# Patient Record
Sex: Male | Born: 1960 | Race: White | Hispanic: No | Marital: Married | Smoking: Current every day smoker
Health system: Southern US, Community
[De-identification: ages and names within clinical notes are randomized; demographics above are authoritative.]

## PROBLEM LIST (undated history)

## (undated) DIAGNOSIS — E785 Hyperlipidemia, unspecified: Secondary | ICD-10-CM

## (undated) DIAGNOSIS — K219 Gastro-esophageal reflux disease without esophagitis: Secondary | ICD-10-CM

## (undated) DIAGNOSIS — E119 Type 2 diabetes mellitus without complications: Secondary | ICD-10-CM

## (undated) DIAGNOSIS — Z86010 Personal history of colonic polyps: Secondary | ICD-10-CM

## (undated) DIAGNOSIS — Z860101 Personal history of adenomatous and serrated colon polyps: Secondary | ICD-10-CM

## (undated) DIAGNOSIS — I10 Essential (primary) hypertension: Secondary | ICD-10-CM

## (undated) DIAGNOSIS — G510 Bell's palsy: Secondary | ICD-10-CM

## (undated) DIAGNOSIS — T7840XA Allergy, unspecified, initial encounter: Secondary | ICD-10-CM

## (undated) HISTORY — PX: CARDIAC CATHETERIZATION: SHX172

## (undated) HISTORY — PX: OTHER SURGICAL HISTORY: SHX169

## (undated) HISTORY — DX: Allergy, unspecified, initial encounter: T78.40XA

## (undated) HISTORY — DX: Essential (primary) hypertension: I10

## (undated) HISTORY — DX: Personal history of adenomatous and serrated colon polyps: Z86.0101

## (undated) HISTORY — DX: Type 2 diabetes mellitus without complications: E11.9

## (undated) HISTORY — DX: Personal history of colonic polyps: Z86.010

## (undated) HISTORY — DX: Gastro-esophageal reflux disease without esophagitis: K21.9

## (undated) HISTORY — PX: COLONOSCOPY: SHX174

## (undated) HISTORY — PX: LUMBAR PUNCTURE: SHX1985

## (undated) HISTORY — DX: Hyperlipidemia, unspecified: E78.5

## (undated) HISTORY — DX: Bell's palsy: G51.0

## (undated) HISTORY — PX: POLYPECTOMY: SHX149

---

## 2005-05-06 ENCOUNTER — Observation Stay (HOSPITAL_COMMUNITY): Admission: EM | Admit: 2005-05-06 | Discharge: 2005-05-06 | Payer: Self-pay | Admitting: Emergency Medicine

## 2005-05-07 ENCOUNTER — Ambulatory Visit: Payer: Self-pay

## 2005-05-14 ENCOUNTER — Ambulatory Visit: Payer: Self-pay | Admitting: Internal Medicine

## 2005-05-15 ENCOUNTER — Inpatient Hospital Stay (HOSPITAL_BASED_OUTPATIENT_CLINIC_OR_DEPARTMENT_OTHER): Admission: RE | Admit: 2005-05-15 | Discharge: 2005-05-15 | Payer: Self-pay | Admitting: Internal Medicine

## 2005-05-28 ENCOUNTER — Ambulatory Visit: Payer: Self-pay | Admitting: Internal Medicine

## 2010-08-13 ENCOUNTER — Encounter (INDEPENDENT_AMBULATORY_CARE_PROVIDER_SITE_OTHER): Payer: Self-pay | Admitting: *Deleted

## 2010-08-21 NOTE — Letter (Signed)
Summary: Pre Visit Letter Revised  [facility]  [address] [place], [place] [address]   Phone: [phone]  Fax: [phone]        08/13/2010 MRN: [id number] John Khan [address] [place], [place]  [address]             Procedure Date:  September 04, 2010   dir col-Dr Annamarie Major to the Gastroenterology Division at [facility].    You are scheduled to see a nurse for your pre-procedure visit on August 22, 2010 at 8:00am on the 3rd floor at [facility], [address]  We ask that you try to arrive at our office 15 minutes prior to your appointment time to allow for check-in.  Please take a minute to review the attached form.  If you answer "Yes" to one or more of the questions on the first page, we ask that you call the person listed at your earliest opportunity.  If you answer "No" to all of the questions, please complete the rest of the form and bring it to your appointment.    Your nurse visit will consist of discussing your medical and surgical history, your immediate family medical history, and your medications.   If you are unable to list all of your medications on the form, please bring the medication bottles to your appointment and we will list them.  We will need to be aware of both prescribed and over the counter drugs.  We will need to know exact dosage information as well.    Please be prepared to read and sign documents such as consent forms, a financial agreement, and acknowledgement forms.  If necessary, and with your consent, a friend or relative is welcome to sit-in on the nurse visit with you.  Please bring your insurance card so that we may make a copy of it.  If your insurance requires a referral to see a specialist, please bring your referral form from your primary care physician.  No co-pay is required for this nurse visit.     If you cannot keep your appointment, please call [phone] to cancel or reschedule prior to your  appointment date.  This allows [place] the opportunity to schedule an appointment for another patient in need of care.    Thank you for choosing [facility] for your medical needs.  We appreciate the opportunity to care for you.  Please visit [place] at our website  to learn more about our practice.  Sincerely, The Gastroenterology Division

## 2010-08-22 ENCOUNTER — Ambulatory Visit (AMBULATORY_SURGERY_CENTER): Payer: BC Managed Care – PPO | Admitting: *Deleted

## 2010-08-22 ENCOUNTER — Encounter: Payer: Self-pay | Admitting: Gastroenterology

## 2010-08-22 VITALS — Ht 67.0 in | Wt 214.0 lb

## 2010-08-22 DIAGNOSIS — Z1211 Encounter for screening for malignant neoplasm of colon: Secondary | ICD-10-CM

## 2010-08-22 MED ORDER — PEG-KCL-NACL-NASULF-NA ASC-C 100 G PO SOLR: 1.0000 | Freq: Once | ORAL | Status: AC

## 2010-09-03 ENCOUNTER — Encounter: Payer: Self-pay | Admitting: Gastroenterology

## 2010-09-04 ENCOUNTER — Ambulatory Visit (AMBULATORY_SURGERY_CENTER): Payer: BC Managed Care – PPO | Admitting: Gastroenterology

## 2010-09-04 ENCOUNTER — Encounter: Payer: Self-pay | Admitting: Gastroenterology

## 2010-09-04 VITALS — BP 125/96 | HR 76 | Temp 97.8°F | Resp 18 | Ht 67.0 in | Wt 215.0 lb

## 2010-09-04 DIAGNOSIS — D126 Benign neoplasm of colon, unspecified: Secondary | ICD-10-CM

## 2010-09-04 DIAGNOSIS — Z1211 Encounter for screening for malignant neoplasm of colon: Secondary | ICD-10-CM

## 2010-09-04 DIAGNOSIS — K620 Anal polyp: Secondary | ICD-10-CM

## 2010-09-04 NOTE — Progress Notes (Signed)
Pr stated did not pass gas, but feels better, pt d/c home

## 2010-09-04 NOTE — Patient Instructions (Signed)
Discharge instructions reviewed  Polyp education given  HOLD aspirin, aspirin products, and anti-inflammatories for 2 weeks  Pathology results should be received in approximately 2 weeks via mail

## 2010-09-04 NOTE — Progress Notes (Signed)
Pt escorted to bathroom to pass gas, states feels better just "tummy feels tight"

## 2010-09-05 ENCOUNTER — Telehealth: Payer: Self-pay | Admitting: *Deleted

## 2010-09-05 NOTE — Telephone Encounter (Signed)

## 2010-09-11 ENCOUNTER — Encounter: Payer: Self-pay | Admitting: Gastroenterology

## 2011-01-21 ENCOUNTER — Encounter: Payer: Self-pay | Admitting: *Deleted

## 2012-05-06 LAB — LIPID PANEL
Cholesterol: 189 mg/dL (ref 0–200)
HDL: 32 mg/dL — AB (ref 35–70)

## 2012-05-06 LAB — HM DIABETES EYE EXAM

## 2012-05-06 LAB — BASIC METABOLIC PANEL: Glucose: 118 mg/dL

## 2012-08-14 ENCOUNTER — Encounter: Payer: Self-pay | Admitting: Physician Assistant

## 2012-08-18 ENCOUNTER — Ambulatory Visit (INDEPENDENT_AMBULATORY_CARE_PROVIDER_SITE_OTHER): Payer: BC Managed Care – PPO | Admitting: Physician Assistant

## 2012-08-18 ENCOUNTER — Encounter: Payer: Self-pay | Admitting: Physician Assistant

## 2012-08-18 VITALS — BP 153/97 | HR 72 | Temp 97.8°F | Ht 67.0 in | Wt 213.0 lb

## 2012-08-18 DIAGNOSIS — E1149 Type 2 diabetes mellitus with other diabetic neurological complication: Secondary | ICD-10-CM

## 2012-08-18 DIAGNOSIS — G4700 Insomnia, unspecified: Secondary | ICD-10-CM

## 2012-08-18 DIAGNOSIS — E785 Hyperlipidemia, unspecified: Secondary | ICD-10-CM

## 2012-08-18 DIAGNOSIS — I10 Essential (primary) hypertension: Secondary | ICD-10-CM | POA: Insufficient documentation

## 2012-08-18 DIAGNOSIS — E119 Type 2 diabetes mellitus without complications: Secondary | ICD-10-CM

## 2012-08-18 LAB — CBC WITH DIFFERENTIAL/PLATELET
Basophils Absolute: 0.1 10*3/uL (ref 0.0–0.1)
Basophils Relative: 1 % (ref 0–1)
Eosinophils Relative: 5 % (ref 0–5)
HCT: 43.8 % (ref 39.0–52.0)
MCH: 31.1 pg (ref 26.0–34.0)
MCHC: 34.7 g/dL (ref 30.0–36.0)
MCV: 89.6 fL (ref 78.0–100.0)
Monocytes Absolute: 0.7 10*3/uL (ref 0.1–1.0)
RDW: 13.4 % (ref 11.5–15.5)

## 2012-08-18 LAB — COMPREHENSIVE METABOLIC PANEL
AST: 19 U/L (ref 0–37)
BUN: 11 mg/dL (ref 6–23)
Calcium: 9.3 mg/dL (ref 8.4–10.5)
Chloride: 103 mEq/L (ref 96–112)
Creat: 0.89 mg/dL (ref 0.50–1.35)

## 2012-08-18 MED ORDER — AMITRIPTYLINE HCL 10 MG PO TABS: 10.0000 mg | ORAL_TABLET | Freq: Every day | ORAL | Status: DC

## 2012-08-18 MED ORDER — METFORMIN HCL 500 MG PO TABS: 500.0000 mg | ORAL_TABLET | Freq: Every day | ORAL | Status: DC

## 2012-08-18 MED ORDER — AMLODIPINE BESYLATE 10 MG PO TABS: 10.0000 mg | ORAL_TABLET | Freq: Every day | ORAL | Status: DC

## 2012-08-18 MED ORDER — ROSUVASTATIN CALCIUM 10 MG PO TABS: 10.0000 mg | ORAL_TABLET | Freq: Every day | ORAL | Status: DC

## 2012-08-18 NOTE — Progress Notes (Signed)
  Subjective:    Patient ID: John Khan, male    DOB: [birth year], 52 y.o.   MRN: [id number]  HPI Comments: Recheck diabetes, htn, hyperlipidemia; Dec gut bug has resolved  Diabetes  Hypertension  Hyperlipidemia      Review of Systems  All other systems reviewed and are negative.       Objective:   Physical Exam  Constitutional: He is oriented to person, place, and time. He appears well-developed and well-nourished.  HENT:  Head: Normocephalic and atraumatic.  Right Ear: External ear normal.  Left Ear: External ear normal.  Nose: Nose normal.  Mouth/Throat: Oropharynx is clear and moist.  Eyes: Conjunctivae and EOM are normal. Pupils are equal, round, and reactive to light.  Neck: Normal range of motion. Neck supple. No JVD present. No tracheal deviation present. No thyromegaly present.  Cardiovascular: Normal rate, regular rhythm and intact distal pulses.   No murmur heard. Pulmonary/Chest: Effort normal and breath sounds normal.  Abdominal: Soft. Bowel sounds are normal. There is no tenderness.  Musculoskeletal: Normal range of motion. He exhibits no edema.  Neurological: He is alert and oriented to person, place, and time. He has normal reflexes.  Skin: Skin is warm and dry.  Psychiatric: He has a normal mood and affect. His behavior is normal. Judgment and thought content normal.          Assessment & Plan:

## 2012-08-19 NOTE — Progress Notes (Signed)
Quick Note:  Labs were within normal limits ______ 

## 2012-08-26 ENCOUNTER — Telehealth: Payer: Self-pay | Admitting: *Deleted

## 2012-08-26 NOTE — Telephone Encounter (Signed)
John Khan stopped by to let us know that John Khan had not been taking his cholesterol and hypertension medication approx 2 weeks to 1 month prior to his appt and labwork.  He has since restarted it and is taking is regularly now.

## 2012-08-26 NOTE — Telephone Encounter (Signed)
Thanks for the update

## 2012-09-01 ENCOUNTER — Encounter: Payer: Self-pay | Admitting: Physician Assistant

## 2012-11-18 ENCOUNTER — Encounter: Payer: Self-pay | Admitting: General Practice

## 2012-11-18 ENCOUNTER — Encounter (INDEPENDENT_AMBULATORY_CARE_PROVIDER_SITE_OTHER): Payer: BC Managed Care – PPO | Admitting: General Practice

## 2012-11-18 NOTE — Progress Notes (Signed)
Patient ID: John Khan, male   DOB: Jan 08, 1961, 52 y.o.   MRN: [id number]  Patient left exam room without being seen by a provider.

## 2012-12-09 ENCOUNTER — Encounter: Payer: Self-pay | Admitting: Family Medicine

## 2012-12-09 ENCOUNTER — Ambulatory Visit (INDEPENDENT_AMBULATORY_CARE_PROVIDER_SITE_OTHER): Payer: BC Managed Care – PPO | Admitting: Family Medicine

## 2012-12-09 VITALS — BP 142/86 | HR 85 | Temp 99.4°F | Ht 67.0 in | Wt 214.6 lb

## 2012-12-09 DIAGNOSIS — B353 Tinea pedis: Secondary | ICD-10-CM

## 2012-12-09 DIAGNOSIS — I10 Essential (primary) hypertension: Secondary | ICD-10-CM

## 2012-12-09 DIAGNOSIS — Z0000 Encounter for general adult medical examination without abnormal findings: Secondary | ICD-10-CM

## 2012-12-09 DIAGNOSIS — E119 Type 2 diabetes mellitus without complications: Secondary | ICD-10-CM

## 2012-12-09 DIAGNOSIS — E785 Hyperlipidemia, unspecified: Secondary | ICD-10-CM

## 2012-12-09 LAB — POCT CBC
Granulocyte percent: 61.3 %G (ref 37–80)
HCT, POC: 44.3 % (ref 43.5–53.7)
Hemoglobin: 15.9 g/dL (ref 14.1–18.1)
Lymph, poc: 3.6 — AB (ref 0.6–3.4)
MCH, POC: 32.8 pg — AB (ref 27–31.2)
MCHC: 35.9 g/dL — AB (ref 31.8–35.4)
MCV: 91.4 fL (ref 80–97)
MPV: 8.6 fL (ref 0–99.8)
POC Granulocyte: 6.4 (ref 2–6.9)
POC LYMPH PERCENT: 34.3 %L (ref 10–50)
Platelet Count, POC: 227 10*3/uL (ref 142–424)
RBC: 4.8 M/uL (ref 4.69–6.13)
RDW, POC: 13.4 %
WBC: 10.5 10*3/uL — AB (ref 4.6–10.2)

## 2012-12-09 LAB — LIPID PANEL
Cholesterol: 163 mg/dL (ref 0–200)
HDL: 35 mg/dL — ABNORMAL LOW (ref >39)
LDL Cholesterol: 94 mg/dL (ref 0–99)
Total CHOL/HDL Ratio: 4.7 Ratio
Triglycerides: 171 mg/dL — ABNORMAL HIGH (ref <150)
VLDL: 34 mg/dL (ref 0–40)

## 2012-12-09 LAB — POCT GLYCOSYLATED HEMOGLOBIN (HGB A1C): Hemoglobin A1C: 6.4

## 2012-12-09 MED ORDER — BUTENAFINE HCL 1 % EX CREA: 30.0000 g | TOPICAL_CREAM | Freq: Two times a day (BID) | CUTANEOUS | Status: DC

## 2012-12-09 MED ORDER — ROSUVASTATIN CALCIUM 10 MG PO TABS: 10.0000 mg | ORAL_TABLET | Freq: Every day | ORAL | Status: DC

## 2012-12-09 MED ORDER — MUPIROCIN CALCIUM 2 % EX CREA: TOPICAL_CREAM | Freq: Three times a day (TID) | CUTANEOUS | Status: DC

## 2012-12-09 MED ORDER — METFORMIN HCL 500 MG PO TABS: 500.0000 mg | ORAL_TABLET | Freq: Every day | ORAL | Status: DC

## 2012-12-09 MED ORDER — AMLODIPINE BESYLATE 10 MG PO TABS: 10.0000 mg | ORAL_TABLET | Freq: Every day | ORAL | Status: DC

## 2012-12-09 NOTE — Progress Notes (Signed)
  Subjective:    Patient ID: John Khan, male    DOB: Apr 20, 1961, 52 y.o.   MRN: [id number]  HPI This 52 y.o. male presents for evaluation of hypertension, hyperlipidemia, and diabetes.  He has some problems with tinea pedis And uses mentax cream which helps.  He has had colonoscopy 2 years ago.  He is a cigarette smoker and is planning on quitting. He has hx of DM, hyperlipidemia,hypertension, and tobacco abuse.     Review of Systems    No chest pain, SOB, HA, dizziness, vision change, N/V, diarrhea, constipation, dysuria, urinary urgency or frequency, myalgias, arthralgias or rash.  Objective:   Physical Exam Vital signs noted  Well developed well nourished male.  HEENT - Head atraumatic Normocephalic                Eyes - PERRLA, Conjuctiva - clear Sclera- Clear EOMI                Ears - EAC's Wnl TM's Wnl Gross Hearing WNL                Nose - Nares patent                 Throat - oropharanx wnl Respiratory - Lungs CTA bilateral Cardiac - RRR S1 and S2 without murmur GI - Abdomen soft Nontender and bowel sounds active x 4 Extremities - No edema. Neuro - Grossly intact.       Assessment & Plan:  Routine general medical examination at a health care facility - Plan: PSA, COMPLETE METABOLIC PANEL WITH GFR, Lipid panel, POCT glycosylated hemoglobin (Hb A1C), POCT CBC Up to date with colonoscopy, discussed getting eye exam, discussed quitting smoking.  Follow up in 3 months.  Diabetes - Plan: metFORMIN (GLUCOPHAGE) 500 MG tablet bid, check hgbaic.  Follow up in 3 months.  Tinea pedis - Plan: Butenafine HCl (MENTAX) 1 % cream, mupirocin cream (BACTROBAN) 2 % ointimen.  Other and unspecified hyperlipidemia - Plan: rosuvastatin (CRESTOR) 10 MG tablet,check FLP today.  Essential hypertension, benign - Plan: amLODipine (NORVASC) 10 MG tablet, controlled and continue current regimen.

## 2012-12-09 NOTE — Patient Instructions (Signed)

## 2012-12-10 LAB — COMPLETE METABOLIC PANEL WITH GFR
ALT: 32 U/L (ref 0–53)
AST: 24 U/L (ref 0–37)
Albumin: 4.6 g/dL (ref 3.5–5.2)
Alkaline Phosphatase: 66 U/L (ref 39–117)
BUN: 14 mg/dL (ref 6–23)
CO2: 29 mEq/L (ref 19–32)
Calcium: 9.5 mg/dL (ref 8.4–10.5)
Chloride: 103 mEq/L (ref 96–112)
Creat: 0.96 mg/dL (ref 0.50–1.35)
GFR, Est African American: >89 mL/min
GFR, Est Non African American: >89 mL/min
Glucose, Bld: 127 mg/dL — ABNORMAL HIGH (ref 70–99)
Potassium: 4.3 mEq/L (ref 3.5–5.3)
Sodium: 141 mEq/L (ref 135–145)
Total Bilirubin: 0.8 mg/dL (ref 0.3–1.2)
Total Protein: 7.3 g/dL (ref 6.0–8.3)

## 2012-12-10 LAB — PSA: PSA: 0.32 ng/mL (ref <=4.00)

## 2013-03-11 ENCOUNTER — Ambulatory Visit: Payer: BC Managed Care – PPO | Admitting: Family Medicine

## 2013-03-17 ENCOUNTER — Encounter: Payer: Self-pay | Admitting: Family Medicine

## 2013-03-17 ENCOUNTER — Ambulatory Visit (INDEPENDENT_AMBULATORY_CARE_PROVIDER_SITE_OTHER): Payer: BC Managed Care – PPO | Admitting: Family Medicine

## 2013-03-17 ENCOUNTER — Other Ambulatory Visit: Payer: Self-pay | Admitting: Family Medicine

## 2013-03-17 VITALS — BP 138/86 | HR 78 | Temp 97.8°F | Ht 67.0 in | Wt 213.0 lb

## 2013-03-17 DIAGNOSIS — E119 Type 2 diabetes mellitus without complications: Secondary | ICD-10-CM

## 2013-03-17 LAB — POCT GLYCOSYLATED HEMOGLOBIN (HGB A1C): Hemoglobin A1C: 5.8

## 2013-03-17 NOTE — Progress Notes (Signed)
  Subjective:    Patient ID: John Khan, male    DOB: [birth year], 52 y.o.   MRN: [id number]  HPI This 52 y.o. male presents for evaluation of follow up on his diabetes.  He is here to get His hgbaic checked.  He has had labs at last visit.    Review of Systems    No chest pain, SOB, HA, dizziness, vision change, N/V, diarrhea, constipation, dysuria, urinary urgency or frequency, myalgias, arthralgias or rash.  Objective:   Physical Exam Vital signs noted  Well developed well nourished male.  HEENT - Head atraumatic Normocephalic                Eyes - PERRLA, Conjuctiva - clear Sclera- Clear EOMI                Ears - EAC's Wnl TM's Wnl Gross Hearing WNL                Nose - Nares patent                 Throat - oropharanx wnl Respiratory - Lungs CTA bilateral Cardiac - RRR S1 and S2 without murmur GI - Abdomen soft Nontender and bowel sounds active x 4 Extremities - No edema. Neuro - Grossly intact.       Assessment & Plan:  Diabetes - Plan: POCT glycosylated hemoglobin (Hb A1C). Follow up in 4 months Deatra Canter FNP

## 2013-07-15 ENCOUNTER — Encounter: Payer: Self-pay | Admitting: Gastroenterology

## 2013-07-21 ENCOUNTER — Ambulatory Visit: Payer: BC Managed Care – PPO | Admitting: Family Medicine

## 2013-07-29 ENCOUNTER — Ambulatory Visit: Payer: BC Managed Care – PPO | Admitting: Family Medicine

## 2013-08-06 ENCOUNTER — Other Ambulatory Visit: Payer: Self-pay | Admitting: Family Medicine

## 2013-08-09 NOTE — Telephone Encounter (Signed)
Last seen 03/17/13  JWO  Last lipid 12/09/12  Last glucose 03/11/13

## 2013-08-11 ENCOUNTER — Ambulatory Visit (INDEPENDENT_AMBULATORY_CARE_PROVIDER_SITE_OTHER): Payer: BC Managed Care – PPO | Admitting: Family Medicine

## 2013-08-11 ENCOUNTER — Encounter: Payer: Self-pay | Admitting: Family Medicine

## 2013-08-11 VITALS — BP 136/90 | HR 74 | Temp 97.9°F | Ht 67.0 in | Wt 215.8 lb

## 2013-08-11 DIAGNOSIS — I10 Essential (primary) hypertension: Secondary | ICD-10-CM

## 2013-08-11 DIAGNOSIS — E119 Type 2 diabetes mellitus without complications: Secondary | ICD-10-CM

## 2013-08-11 DIAGNOSIS — E785 Hyperlipidemia, unspecified: Secondary | ICD-10-CM

## 2013-08-11 LAB — POCT CBC
Granulocyte percent: 59 %G (ref 37–80)
HCT, POC: 44.3 % (ref 43.5–53.7)
Hemoglobin: 14.5 g/dL (ref 14.1–18.1)
Lymph, poc: 3.2 (ref 0.6–3.4)
MCH, POC: 30.4 pg (ref 27–31.2)
MCHC: 32.7 g/dL (ref 31.8–35.4)
MCV: 93.1 fL (ref 80–97)
MPV: 8.6 fL (ref 0–99.8)
POC Granulocyte: 5.1 (ref 2–6.9)
POC LYMPH PERCENT: 37.4 %L (ref 10–50)
Platelet Count, POC: 215 10*3/uL (ref 142–424)
RBC: 4.8 M/uL (ref 4.69–6.13)
RDW, POC: 13.3 %
WBC: 8.6 10*3/uL (ref 4.6–10.2)

## 2013-08-11 LAB — POCT GLYCOSYLATED HEMOGLOBIN (HGB A1C): Hemoglobin A1C: 6.7

## 2013-08-11 MED ORDER — ROSUVASTATIN CALCIUM 10 MG PO TABS: 10.0000 mg | ORAL_TABLET | Freq: Every day | ORAL | Status: DC

## 2013-08-11 MED ORDER — AMLODIPINE BESYLATE 10 MG PO TABS: 10.0000 mg | ORAL_TABLET | Freq: Every day | ORAL | Status: DC

## 2013-08-11 MED ORDER — METFORMIN HCL 500 MG PO TABS: 500.0000 mg | ORAL_TABLET | Freq: Every day | ORAL | Status: DC

## 2013-08-11 NOTE — Patient Instructions (Signed)

## 2013-08-11 NOTE — Progress Notes (Signed)
   Subjective:    Patient ID: John Khan, male    DOB: [birth year], 53 y.o.   MRN: [id number]  HPI This 53 y.o. male presents for evaluation of routine follow up. He has hx of diabetes and hypertension.  Review of Systems No chest pain, SOB, HA, dizziness, vision change, N/V, diarrhea, constipation, dysuria, urinary urgency or frequency, myalgias, arthralgias or rash.     Objective:   Physical Exam  Vital signs noted  Well developed well nourished male.  HEENT - Head atraumatic Normocephalic                Eyes - PERRLA, Conjuctiva - clear Sclera- Clear EOMI                Ears - EAC's Wnl TM's Wnl Gross Hearing WNL                Nose - Nares patent                 Throat - oropharanx wnl Respiratory - Lungs CTA bilateral Cardiac - RRR S1 and S2 without murmur GI - Abdomen soft Nontender and bowel sounds active x 4 Extremities - No edema. Neuro - Grossly intact.      Assessment & Plan:  Diabetes - Plan: metFORMIN (GLUCOPHAGE) 500 MG tablet, POCT glycosylated hemoglobin (Hb A1C), CMP14+EGFR  Essential hypertension, benign - Plan: amLODipine (NORVASC) 10 MG tablet, POCT CBC  Other and unspecified hyperlipidemia - Plan: rosuvastatin (CRESTOR) 10 MG tablet, Lipid panel, DISCONTINUED: rosuvastatin (CRESTOR) 10 MG tablet  Lysbeth Penner FNP

## 2013-08-13 LAB — CMP14+EGFR
ALT: 27 IU/L (ref 0–44)
AST: 19 IU/L (ref 0–40)
Albumin/Globulin Ratio: 1.8 (ref 1.1–2.5)
Albumin: 4.3 g/dL (ref 3.5–5.5)
Alkaline Phosphatase: 66 IU/L (ref 39–117)
BUN/Creatinine Ratio: 14 (ref 9–20)
BUN: 14 mg/dL (ref 6–24)
CO2: 20 mmol/L (ref 18–29)
Calcium: 9.5 mg/dL (ref 8.7–10.2)
Chloride: 102 mmol/L (ref 97–108)
Creatinine, Ser: 0.98 mg/dL (ref 0.76–1.27)
GFR calc Af Amer: 101 mL/min/{1.73_m2} (ref >59)
GFR calc non Af Amer: 88 mL/min/{1.73_m2} (ref >59)
Globulin, Total: 2.4 g/dL (ref 1.5–4.5)
Glucose: 120 mg/dL — ABNORMAL HIGH (ref 65–99)
Potassium: 4.2 mmol/L (ref 3.5–5.2)
Sodium: 142 mmol/L (ref 134–144)
Total Bilirubin: 0.6 mg/dL (ref 0.0–1.2)
Total Protein: 6.7 g/dL (ref 6.0–8.5)

## 2013-08-13 LAB — LIPID PANEL
Chol/HDL Ratio: 6.1 ratio units — ABNORMAL HIGH (ref 0.0–5.0)
Cholesterol, Total: 190 mg/dL (ref 100–199)
HDL: 31 mg/dL — ABNORMAL LOW (ref >39)
LDL Calculated: 112 mg/dL — ABNORMAL HIGH (ref 0–99)
Triglycerides: 236 mg/dL — ABNORMAL HIGH (ref 0–149)
VLDL Cholesterol Cal: 47 mg/dL — ABNORMAL HIGH (ref 5–40)

## 2013-08-16 ENCOUNTER — Other Ambulatory Visit: Payer: Self-pay | Admitting: Family Medicine

## 2013-08-19 ENCOUNTER — Telehealth: Payer: Self-pay | Admitting: Family Medicine

## 2013-08-19 NOTE — Telephone Encounter (Signed)
Patient aware of results.

## 2013-11-05 ENCOUNTER — Encounter: Payer: Self-pay | Admitting: Family

## 2013-11-05 ENCOUNTER — Ambulatory Visit (INDEPENDENT_AMBULATORY_CARE_PROVIDER_SITE_OTHER): Payer: BC Managed Care – PPO | Admitting: Family

## 2013-11-05 VITALS — BP 150/93 | HR 80 | Temp 98.0°F | Ht 67.0 in | Wt 217.8 lb

## 2013-11-05 DIAGNOSIS — IMO0002 Reserved for concepts with insufficient information to code with codable children: Secondary | ICD-10-CM

## 2013-11-05 DIAGNOSIS — R0789 Other chest pain: Secondary | ICD-10-CM

## 2013-11-05 DIAGNOSIS — S29011A Strain of muscle and tendon of front wall of thorax, initial encounter: Secondary | ICD-10-CM

## 2013-11-05 MED ORDER — CYCLOBENZAPRINE HCL 5 MG PO TABS: 5.0000 mg | ORAL_TABLET | Freq: Three times a day (TID) | ORAL | Status: DC | PRN

## 2013-11-05 MED ORDER — NAPROXEN 500 MG PO TABS: 500.0000 mg | ORAL_TABLET | Freq: Two times a day (BID) | ORAL | Status: DC

## 2013-11-05 NOTE — Progress Notes (Signed)
   Subjective:    Patient ID: John Khan, male    DOB: [birth year], 53 y.o.   MRN: [id number]  Muscle Pain This is a new problem. The current episode started more than 1 month ago (Couple months). The problem occurs 2 to 4 times per day. The problem has been gradually worsening since onset. The pain is present in the chest and right arm. The pain is mild. The symptoms are aggravated by any movement. Associated symptoms include fatigue. Pertinent negatives include no constipation, diarrhea, joint swelling, rash, stiffness, shortness of breath or wheezing. Past treatments include nothing. The treatment provided no relief. There is no swelling present.      Review of Systems  Constitutional: Positive for fatigue.  Respiratory: Negative for shortness of breath and wheezing.   Gastrointestinal: Negative for diarrhea and constipation.  Musculoskeletal: Negative for joint swelling and stiffness.  Skin: Negative for rash.  Psychiatric/Behavioral: The patient is not nervous/anxious.   All other systems reviewed and are negative.      Objective:   Physical Exam  Vitals reviewed. Constitutional: He is oriented to person, place, and time. He appears well-developed and well-nourished. No distress.  HENT:  Head: Normocephalic.  Right Ear: External ear normal.  Left Ear: External ear normal.  Mouth/Throat: Oropharynx is clear and moist.  Eyes: Pupils are equal, round, and reactive to light. Right eye exhibits no discharge. Left eye exhibits no discharge.  Neck: Normal range of motion. Neck supple. No thyromegaly present.  Cardiovascular: Normal rate, regular rhythm, normal heart sounds and intact distal pulses.   No murmur heard. Pulmonary/Chest: Effort normal and breath sounds normal. No respiratory distress. He has no wheezes.  Abdominal: Soft. Bowel sounds are normal. He exhibits no distension. There is no tenderness.  Musculoskeletal: Normal range of motion. He exhibits no edema and no  tenderness.  Neurological: He is alert and oriented to person, place, and time. He has normal reflexes. No cranial nerve deficit.  Skin: Skin is warm and dry. No rash noted. No erythema.  Psychiatric: He has a normal mood and affect. His behavior is normal. Judgment and thought content normal.    EKG-Normal sinus rhythm  BP 150/93  Pulse 80  Temp(Src) 98 F (36.7 C) (Oral)  Ht 5\' 7"  (1.702 m)  Wt 217 lb 12.8 oz (98.793 kg)  BMI 34.10 kg/m2     Assessment & Plan:  1. Chest tightness - EKG 12-Lead  2. Chest wall muscle strain -Rest -Ice Elevate -Discussed flexeril may causes drowsiness-Take at bedtime - cyclobenzaprine (FLEXERIL) 5 MG tablet; Take 1 tablet (5 mg total) by mouth 3 (three) times daily as needed for muscle spasms.  Dispense: 30 tablet; Refill: 1 - naproxen (NAPROSYN) 500 MG tablet; Take 1 tablet (500 mg total) by mouth 2 (two) times daily with a meal.  Dispense: 30 tablet; Refill: [place], FNP

## 2013-11-05 NOTE — Patient Instructions (Signed)

## 2013-11-26 ENCOUNTER — Other Ambulatory Visit: Payer: Self-pay | Admitting: Family Medicine

## 2014-02-23 ENCOUNTER — Encounter: Payer: Self-pay | Admitting: Gastroenterology

## 2014-05-18 LAB — HM DIABETES EYE EXAM

## 2014-08-27 ENCOUNTER — Other Ambulatory Visit: Payer: Self-pay | Admitting: Family Medicine

## 2014-09-01 ENCOUNTER — Other Ambulatory Visit: Payer: Self-pay | Admitting: Family Medicine

## 2014-09-05 ENCOUNTER — Other Ambulatory Visit: Payer: Self-pay | Admitting: Family Medicine

## 2014-09-05 MED ORDER — METFORMIN HCL 500 MG PO TABS: 500.0000 mg | ORAL_TABLET | Freq: Every day | ORAL | Status: DC

## 2014-09-05 NOTE — Telephone Encounter (Signed)
done

## 2014-09-27 ENCOUNTER — Ambulatory Visit (INDEPENDENT_AMBULATORY_CARE_PROVIDER_SITE_OTHER): Payer: BLUE CROSS/BLUE SHIELD | Admitting: Family Medicine

## 2014-09-27 ENCOUNTER — Encounter: Payer: Self-pay | Admitting: Family Medicine

## 2014-09-27 VITALS — BP 153/97 | HR 85 | Temp 98.1°F | Ht 67.0 in | Wt 215.0 lb

## 2014-09-27 DIAGNOSIS — E785 Hyperlipidemia, unspecified: Secondary | ICD-10-CM | POA: Diagnosis not present

## 2014-09-27 DIAGNOSIS — I10 Essential (primary) hypertension: Secondary | ICD-10-CM | POA: Diagnosis not present

## 2014-09-27 DIAGNOSIS — E119 Type 2 diabetes mellitus without complications: Secondary | ICD-10-CM | POA: Diagnosis not present

## 2014-09-27 LAB — POCT GLYCOSYLATED HEMOGLOBIN (HGB A1C): Hemoglobin A1C: 6.9

## 2014-09-27 LAB — POCT UA - MICROALBUMIN: Microalbumin Ur, POC: 100 mg/L

## 2014-09-27 MED ORDER — METFORMIN HCL 500 MG PO TABS: 500.0000 mg | ORAL_TABLET | Freq: Every day | ORAL | Status: DC

## 2014-09-27 MED ORDER — IRBESARTAN 150 MG PO TABS: 150.0000 mg | ORAL_TABLET | Freq: Every day | ORAL | Status: DC

## 2014-09-27 MED ORDER — ROSUVASTATIN CALCIUM 10 MG PO TABS: 10.0000 mg | ORAL_TABLET | Freq: Every day | ORAL | Status: DC

## 2014-09-27 MED ORDER — AMLODIPINE BESYLATE 10 MG PO TABS: 10.0000 mg | ORAL_TABLET | Freq: Every day | ORAL | Status: DC

## 2014-09-27 NOTE — Progress Notes (Signed)
Subjective:    Patient ID: John Khan, male    DOB: 09-Mar-1961, 54 y.o.   MRN: [id number]  HPI  54 year old gentleman with diabetes hypertension and hyperlipidemia. He takes metformin only once a day but A1c's have come down nicely to the 5-6 range. By history he has never taken an ace and or or an arb for blood pressure but I think state of knowledge would suggest he should be on that with diabetes. He also relates some myalgias and muscle problems with medicine which suggest and statin intolerance but only see that he has taken Crestor  Chief Complaint  Patient presents with  . Diabetes  . Hyperlipidemia  . Hypertension    Has missed a few doses of medication    Patient Active Problem List   Diagnosis Date Noted  . Diabetes 08/18/2012  . HTN (hypertension) 08/18/2012  . Other and unspecified hyperlipidemia 08/18/2012   Outpatient Encounter Prescriptions as of 09/27/2014  Medication Sig  . amLODipine (NORVASC) 10 MG tablet TAKE 1 TABLET BY MOUTH EVERY DAY  . aspirin 81 MG tablet Take 81 mg by mouth daily.    . Butenafine HCl (MENTAX) 1 % cream Apply 30 g topically 2 (two) times daily.  . CRESTOR 10 MG tablet TAKE 1 TABLET (10 MG TOTAL) BY MOUTH DAILY.  . fish oil-omega-3 fatty acids 1000 MG capsule Take 2 g by mouth daily.    . metFORMIN (GLUCOPHAGE) 500 MG tablet Take 1 tablet (500 mg total) by mouth daily with breakfast.  . Multiple Vitamins-Minerals (MULTIVITAMIN WITH MINERALS) tablet Take 1 tablet by mouth daily.    . [DISCONTINUED] cyclobenzaprine (FLEXERIL) 5 MG tablet Take 1 tablet (5 mg total) by mouth 3 (three) times daily as needed for muscle spasms.  . [DISCONTINUED] naproxen (NAPROSYN) 500 MG tablet Take 1 tablet (500 mg total) by mouth 2 (two) times daily with a meal.     Review of Systems  Constitutional: Negative.   HENT: Negative.   Eyes: Negative.   Respiratory: Negative.  Negative for shortness of breath.   Cardiovascular: Negative.  Negative for chest  pain and leg swelling.  Gastrointestinal: Negative.   Genitourinary: Negative.   Musculoskeletal: Negative.   Skin: Negative.   Neurological: Negative.   Psychiatric/Behavioral: Negative.   All other systems reviewed and are negative.      Objective:   Physical Exam  Constitutional: He is oriented to person, place, and time. He appears well-developed and well-nourished.  HENT:  Head: Normocephalic.  Right Ear: External ear normal.  Left Ear: External ear normal.  Nose: Nose normal.  Mouth/Throat: Oropharynx is clear and moist.  Eyes: Conjunctivae and EOM are normal. Pupils are equal, round, and reactive to light.  Neck: Normal range of motion. Neck supple.  Cardiovascular: Normal rate, regular rhythm, normal heart sounds and intact distal pulses.   Pulmonary/Chest: Effort normal and breath sounds normal.  Abdominal: Soft. Bowel sounds are normal.  Musculoskeletal: Normal range of motion.  Neurological: He is alert and oriented to person, place, and time.  Skin: Skin is warm and dry.  Psychiatric: He has a normal mood and affect. His behavior is normal. Judgment and thought content normal.    BP 153/97 mmHg  Pulse 85  Temp(Src) 98.1 F (36.7 C) (Oral)  Ht _0  (1.702 m)  Wt 215 lb (97.523 kg)  BMI 33.67 kg/m2       Assessment & Plan:  1. Essential hypertension Change from amlodipine to irbesartan for its renal  protective effect  2. Type 2 diabetes mellitus without complication Continue with metformin once a day as long as A1c stays at goal - POCT glycosylated hemoglobin (Hb A1C) - POCT UA - Microalbumin  3. Hyperlipidemia Continue with Crestor pending results of lab - CMP14+EGFR - Lipid panel  Wardell Honour MD

## 2014-09-27 NOTE — Addendum Note (Signed)
Addended by: Earlene Plater on: 09/27/2014 03:23 PM   Modules accepted: Orders

## 2014-09-28 ENCOUNTER — Telehealth: Payer: Self-pay | Admitting: *Deleted

## 2014-09-28 LAB — CMP14+EGFR
ALK PHOS: 69 IU/L (ref 39–117)
ALT: 17 IU/L (ref 0–44)
AST: 16 IU/L (ref 0–40)
Albumin/Globulin Ratio: 1.6 (ref 1.1–2.5)
Albumin: 4 g/dL (ref 3.5–5.5)
BUN / CREAT RATIO: 12 (ref 9–20)
BUN: 12 mg/dL (ref 6–24)
Bilirubin Total: 0.4 mg/dL (ref 0.0–1.2)
CO2: 24 mmol/L (ref 18–29)
CREATININE: 1.03 mg/dL (ref 0.76–1.27)
Calcium: 9.6 mg/dL (ref 8.7–10.2)
Chloride: 103 mmol/L (ref 97–108)
GFR calc Af Amer: 95 mL/min/{1.73_m2} (ref >59)
GFR calc non Af Amer: 82 mL/min/{1.73_m2} (ref >59)
GLOBULIN, TOTAL: 2.5 g/dL (ref 1.5–4.5)
GLUCOSE: 152 mg/dL — AB (ref 65–99)
Potassium: 4.1 mmol/L (ref 3.5–5.2)
Sodium: 143 mmol/L (ref 134–144)
TOTAL PROTEIN: 6.5 g/dL (ref 6.0–8.5)

## 2014-09-28 LAB — LIPID PANEL
Chol/HDL Ratio: 7.9 ratio units — ABNORMAL HIGH (ref 0.0–5.0)
Cholesterol, Total: 236 mg/dL — ABNORMAL HIGH (ref 100–199)
HDL: 30 mg/dL — ABNORMAL LOW (ref >39)
LDL CALC: 144 mg/dL — AB (ref 0–99)
TRIGLYCERIDES: 311 mg/dL — AB (ref 0–149)
VLDL Cholesterol Cal: 62 mg/dL — ABNORMAL HIGH (ref 5–40)

## 2014-09-28 LAB — MICROALBUMIN, URINE: Microalbumin, Urine: 813.6 ug/mL — ABNORMAL HIGH (ref 0.0–17.0)

## 2014-09-28 NOTE — Telephone Encounter (Signed)
Pt notified of results

## 2014-09-28 NOTE — Telephone Encounter (Signed)
-----   Message from John Honour, MD sent at 09/27/2014  5:17 PM EDT ----- Hemoglobin A1c is still at goal. There is protein in the urine but I started him on losartan today which is even more appropriate given this later finding

## 2014-09-30 NOTE — Progress Notes (Signed)
lmtcb

## 2014-10-03 ENCOUNTER — Other Ambulatory Visit: Payer: Self-pay

## 2014-10-03 MED ORDER — METFORMIN HCL 500 MG PO TABS: 500.0000 mg | ORAL_TABLET | Freq: Every day | ORAL | Status: DC

## 2014-10-12 ENCOUNTER — Ambulatory Visit (INDEPENDENT_AMBULATORY_CARE_PROVIDER_SITE_OTHER): Payer: BLUE CROSS/BLUE SHIELD

## 2014-10-12 ENCOUNTER — Ambulatory Visit (INDEPENDENT_AMBULATORY_CARE_PROVIDER_SITE_OTHER): Payer: BLUE CROSS/BLUE SHIELD | Admitting: Family Medicine

## 2014-10-12 VITALS — BP 162/84 | HR 96 | Temp 98.2°F | Resp 15 | Ht 67.0 in | Wt 214.0 lb

## 2014-10-12 DIAGNOSIS — M25561 Pain in right knee: Secondary | ICD-10-CM | POA: Diagnosis not present

## 2014-10-12 LAB — POCT CBC
Granulocyte percent: 67.5 %G (ref 37–80)
HCT, POC: 47.3 % (ref 43.5–53.7)
Hemoglobin: 15.2 g/dL (ref 14.1–18.1)
LYMPH, POC: 3.3 (ref 0.6–3.4)
MCH, POC: 29.8 pg (ref 27–31.2)
MCHC: 32.1 g/dL (ref 31.8–35.4)
MCV: 92.8 fL (ref 80–97)
MID (cbc): 0.4 (ref 0–0.9)
MPV: 7.9 fL (ref 0–99.8)
POC Granulocyte: 7.7 — AB (ref 2–6.9)
POC LYMPH PERCENT: 29.3 %L (ref 10–50)
POC MID %: 3.2 % (ref 0–12)
Platelet Count, POC: 267 10*3/uL (ref 142–424)
RBC: 5.1 M/uL (ref 4.69–6.13)
RDW, POC: 13.6 %
WBC: 11.4 10*3/uL — AB (ref 4.6–10.2)

## 2014-10-12 MED ORDER — HYDROCODONE-ACETAMINOPHEN 5-325 MG PO TABS: 1.0000 | ORAL_TABLET | Freq: Three times a day (TID) | ORAL | Status: DC | PRN

## 2014-10-12 MED ORDER — DICLOFENAC SODIUM 75 MG PO TBEC: 75.0000 mg | DELAYED_RELEASE_TABLET | Freq: Two times a day (BID) | ORAL | Status: DC

## 2014-10-12 NOTE — Patient Instructions (Signed)
Use the knee immobilizer to help stabilize your knee.   Use the medications as needed for pain- the hydrocodone will make you feel sleepy so do not use it when you need to drive. I will touch base with you tomorrow and see how you are doing- we may need to refer you to orthopedics if you are not improving

## 2014-10-12 NOTE — Progress Notes (Signed)
[facility] [address], [place] [address] [phone]  Date:  10/12/2014   Name:  John Khan   DOB:  02-14-1961   MRN:  [id number]  PCP:  Wardell Honour, MD    Chief Complaint: Knee Injury   History of Present Illness:  John Khan is a 54 y.o. very pleasant male patient who presents with the following:  Here today with concern regarding his right knee.   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 The knee was sore at the end of the day yesterday, still hurt this am and has gotten wose during the day today.   He does tend to kneel a good amount at his job.   He did not fall or have any known discrete injury.  Insidious onset of pain No history of gout, prior knee injury/ surgery/ injection or other serious joint problems  He otherwise has a history of HTN, DM, high cholesterol.  DM generally well controlled per his PCP  Lab Results  Component Value Date   HGBA1C 6.9 09/27/2014    Patient Active Problem List   Diagnosis Date Noted  . Diabetes 08/18/2012  . HTN (hypertension) 08/18/2012  . Hyperlipidemia 08/18/2012    Past Medical History  Diagnosis Date  . Hyperlipidemia   . Hypertension   . Diabetes mellitus without complication     Past Surgical History  Procedure Laterality Date  . Right hand fractured and pinned      History  Substance Use Topics  . Smoking status: Current Every Day Smoker -- 1.00 packs/day    Types: Cigarettes  . Smokeless tobacco: Never Used  . Alcohol Use: No     Comment: Once or twice a year    Family History  Problem Relation Age of Onset  . Diabetes Brother   . Diabetes Maternal Uncle   . Cancer Paternal Uncle   . Heart disease Paternal Uncle   . Diabetes Father   . Diabetes Paternal Uncle     Allergies  Allergen Reactions  . Neosporin [Neomycin-Polymyxin B Gu] Rash    Medication list has been reviewed and updated.  Current Outpatient Prescriptions on File  Prior to Visit  Medication Sig Dispense Refill  . aspirin 81 MG tablet Take 81 mg by mouth daily.      . Butenafine HCl (MENTAX) 1 % cream Apply 30 g topically 2 (two) times daily. 24 g 3  . fish oil-omega-3 fatty acids 1000 MG capsule Take 2 g by mouth daily.      . irbesartan (AVAPRO) 150 MG tablet Take 1 tablet (150 mg total) by mouth daily. 30 tablet 3  . metFORMIN (GLUCOPHAGE) 500 MG tablet Take 1 tablet (500 mg total) by mouth daily with breakfast. 30 tablet 2  . Multiple Vitamins-Minerals (MULTIVITAMIN WITH MINERALS) tablet Take 1 tablet by mouth daily.      . rosuvastatin (CRESTOR) 10 MG tablet Take 1 tablet (10 mg total) by mouth daily. 30 tablet 5  . amLODipine (NORVASC) 10 MG tablet Take 1 tablet (10 mg total) by mouth daily. (Patient not taking: Reported on 10/12/2014) 30 tablet 5   No current facility-administered medications on file prior to visit.    Review of Systems:  As per HPI- otherwise negative.   Physical Examination: Filed Vitals:   10/12/14 1606  BP: 162/84  Pulse: 102  Temp: 98.2 F (36.8 C)  Resp: 15   Filed Vitals:  10/12/14 1606  Height: 5\' 7"  (1.702 m)  Weight: 214 lb (97.07 kg)   Body mass index is 33.51 kg/(m^2). Ideal Body Weight: Weight in (lb) to have BMI = 25: 159.3  GEN: WDWN, NAD, Non-toxic, A & O x 3, overweight HEENT: Atraumatic, Normocephalic. Neck supple. No masses, No LAD. Ears and Nose: No external deformity. CV: RRR, No M/G/R. No JVD. No thrill. No extra heart sounds. PULM: CTA B, no wheezes, crackles, rhonchi. No retractions. No resp. distress. No accessory muscle use. EXTR: No c/c/e NEURO favoring right, having a very hard time walking as he cannot bend right knee without a lot of pain PSYCH: Normally interactive. Conversant. Not depressed or anxious appearing.  Calm demeanor.  Right knee: no swelling, effusion, redness or heat.  He is very tender at the proximal pole of the patella/ quadriceps tendon attachment. Knee is  stable.  He has pain with active ROM and with resisted use of his quadriceps (even against gravity).   [facility] reading (PRIMARY) by  Dr. Lorelei Pont. Right knee: mild degenerative change, OW wnl  RIGHT KNEE - COMPLETE 4+ VIEW  COMPARISON: None.  FINDINGS: No acute fracture or dislocation. Joint spaces are maintained for age. Enthesopathic change at the quadriceps insertion. No joint effusion. Multiple sclerotic foci including within the distal femur and proximal tibia are likely bone islands.  IMPRESSION: No acute osseous abnormality.  Results for orders placed or performed in visit on 10/12/14  POCT CBC  Result Value Ref Range   WBC 11.4 (A) 4.6 - 10.2 K/uL   Lymph, poc 3.3 0.6 - 3.4   POC LYMPH PERCENT 29.3 10 - 50 %L   MID (cbc) 0.4 0 - 0.9   POC MID % 3.2 0 - 12 %M   POC Granulocyte 7.7 (A) 2 - 6.9   Granulocyte percent 67.5 37 - 80 %G   RBC 5.10 4.69 - 6.13 M/uL   Hemoglobin 15.2 14.1 - 18.1 g/dL   HCT, POC 47.3 43.5 - 53.7 %   MCV 92.8 80 - 97 fL   MCH, POC 29.8 27 - 31.2 pg   MCHC 32.1 31.8 - 35.4 g/dL   RDW, POC 13.6 %   Platelet Count, POC 267 142 - 424 K/uL   MPV 7.9 0 - 99.8 fL   Placed in a knee immobilizer which provided a great deal of comfort Assessment and Plan: Right knee pain - Plan: DG Knee Complete 4 Views Right, POCT CBC, CK, diclofenac (VOLTAREN) 75 MG EC tablet, HYDROcodone-acetaminophen (NORCO/VICODIN) 5-325 MG per tablet  Here today with severe knee pain.  Exam does not suggest gout or a joint infection.  Suspect he has severe quadriceps tendonitis.  He felt a lot better in knee immobilizer.  Continue this, ice, relative rest.  voltaren for inflammation, gave a few vicodin to use for severe pain. Will check on his progress and refer to ortho if not improving significantly.   Signed Lamar Blinks, MD

## 2014-10-13 LAB — CK: Total CK: 88 U/L (ref 7–232)

## 2014-10-14 ENCOUNTER — Telehealth: Payer: Self-pay | Admitting: Family Medicine

## 2014-10-14 NOTE — Telephone Encounter (Signed)
Spoke with him yesterday- he reported that his knee was a lot better.  Plan to continue OOW and recheck at my clinic on Monday.  Will arrange to fit him into clinic and we will give him a call.  If any worsening let me know

## 2014-10-17 ENCOUNTER — Ambulatory Visit (INDEPENDENT_AMBULATORY_CARE_PROVIDER_SITE_OTHER): Payer: BLUE CROSS/BLUE SHIELD | Admitting: Family Medicine

## 2014-10-17 ENCOUNTER — Encounter: Payer: Self-pay | Admitting: Family Medicine

## 2014-10-17 VITALS — BP 166/92 | HR 92 | Temp 98.3°F | Resp 16 | Ht 67.0 in | Wt 215.0 lb

## 2014-10-17 DIAGNOSIS — I10 Essential (primary) hypertension: Secondary | ICD-10-CM | POA: Diagnosis not present

## 2014-10-17 DIAGNOSIS — S86911D Strain of unspecified muscle(s) and tendon(s) at lower leg level, right leg, subsequent encounter: Secondary | ICD-10-CM

## 2014-10-17 DIAGNOSIS — S86811D Strain of other muscle(s) and tendon(s) at lower leg level, right leg, subsequent encounter: Secondary | ICD-10-CM

## 2014-10-17 NOTE — Progress Notes (Signed)
[facility] [address], [place] [address] [phone]  Date:  10/17/2014   Name:  John Khan   DOB:  07-20-1960   MRN:  [id number]  PCP:  Wardell Honour, MD    Chief Complaint: Follow-up and Knee Pain   History of Present Illness:  John Khan is a 54 y.o. very pleasant male patient who presents with the following:  Here today to recheck right knee pain - he first noted this last week, thought it may be due to overuse as there was no acute injury.  He works at the [employer] and does a lot of squatting and kneeling. He had pain at the superior right quad insertion.  Since he was seen here on 5/11 he has been taking it easy, using voltaren/ hydrocodone as needed and wearing a hinged knee brace.   He feels that his knee is a lot better, especially in the am he has very little pain although towards the afternoon the knee will hurt more.   He does not have any heat or swelling that he has noticed   Patient Active Problem List   Diagnosis Date Noted  . Diabetes 08/18/2012  . HTN (hypertension) 08/18/2012  . Hyperlipidemia 08/18/2012    Past Medical History  Diagnosis Date  . Hyperlipidemia   . Hypertension   . Diabetes mellitus without complication     Past Surgical History  Procedure Laterality Date  . Right hand fractured and pinned      History  Substance Use Topics  . Smoking status: Current Every Day Smoker -- 1.00 packs/day    Types: Cigarettes  . Smokeless tobacco: Never Used  . Alcohol Use: No     Comment: Once or twice a year    Family History  Problem Relation Age of Onset  . Diabetes Brother   . Diabetes Maternal Uncle   . Cancer Paternal Uncle   . Heart disease Paternal Uncle   . Diabetes Father   . Diabetes Paternal Uncle     Allergies  Allergen Reactions  . Neosporin [Neomycin-Polymyxin B Gu] Rash    Medication list has been reviewed and updated.  Current Outpatient Prescriptions on File Prior to  Visit  Medication Sig Dispense Refill  . aspirin 81 MG tablet Take 81 mg by mouth daily.      . Butenafine HCl (MENTAX) 1 % cream Apply 30 g topically 2 (two) times daily. 24 g 3  . diclofenac (VOLTAREN) 75 MG EC tablet Take 1 tablet (75 mg total) by mouth 2 (two) times daily. Use as needed for knee pain 30 tablet 0  . fish oil-omega-3 fatty acids 1000 MG capsule Take 2 g by mouth daily.      Marland Kitchen HYDROcodone-acetaminophen (NORCO/VICODIN) 5-325 MG per tablet Take 1 tablet by mouth every 8 (eight) hours as needed. 12 tablet 0  . irbesartan (AVAPRO) 150 MG tablet Take 1 tablet (150 mg total) by mouth daily. 30 tablet 3  . metFORMIN (GLUCOPHAGE) 500 MG tablet Take 1 tablet (500 mg total) by mouth daily with breakfast. 30 tablet 2  . Multiple Vitamins-Minerals (MULTIVITAMIN WITH MINERALS) tablet Take 1 tablet by mouth daily.      . rosuvastatin (CRESTOR) 10 MG tablet Take 1 tablet (10 mg total) by mouth daily. 30 tablet 5   No current facility-administered medications on file prior to visit.    Review of Systems:  As per HPI- otherwise negative.   Physical  Examination: Filed Vitals:   10/17/14 0801  BP: 166/92  Pulse: 92  Temp: 98.3 F (36.8 C)  Resp: 16   Filed Vitals:   10/17/14 0801  Height: 5\' 7"  (1.702 m)  Weight: 215 lb (97.523 kg)   Body mass index is 33.67 kg/(m^2). Ideal Body Weight: Weight in (lb) to have BMI = 25: 159.3  GEN: WDWN, NAD, Non-toxic, A & O x 3, overweight,  HEENT: Atraumatic, Normocephalic. Neck supple. No masses, No LAD. Ears and Nose: No external deformity. CV: RRR, No M/G/R. No JVD. No thrill. No extra heart sounds. PULM: CTA B, no wheezes, crackles, rhonchi. No retractions. No resp. distress. No accessory muscle use. EXTR: No c/c/e NEURO Normal gait.  PSYCH: Normally interactive. Conversant. Not depressed or anxious appearing.  Calm demeanor.  Right knee: normal exam. No heat, swelling or tenderness at quad insertion at superior patella today. Normal  ROM, ligaments stable, no effusion.   Normal gain  Assessment and Plan: Knee strain, right, subsequent encounter  Essential hypertension  His knee is much better.  He would like to RTW.  Advised to try and avoid squatting as much as possible.  He will let me know if pain returns   He will call his PCP with recent BP readings.  His medication was adjusted not long ago- he will see if they would like to adjust again as he is running high.   Signed Lamar Blinks, MD

## 2014-10-17 NOTE — Patient Instructions (Signed)
I am glad that you are feeling better!  Try to take it a little easy on your right knee- avoid squatting as much as possible. If doing full duty at work is not working out for you please let me know Use the knee brace as needed Continue voltaren as needed for pain and swelling   BP Readings from Last 3 Encounters:  10/17/14 166/92  10/12/14 162/84  09/27/14 153/97

## 2014-10-24 ENCOUNTER — Other Ambulatory Visit: Payer: Self-pay | Admitting: Family Medicine

## 2015-03-20 ENCOUNTER — Encounter: Payer: Self-pay | Admitting: Family Medicine

## 2015-03-29 ENCOUNTER — Ambulatory Visit: Payer: BLUE CROSS/BLUE SHIELD | Admitting: Family Medicine

## 2015-04-04 ENCOUNTER — Ambulatory Visit: Payer: BLUE CROSS/BLUE SHIELD | Admitting: Family Medicine

## 2015-07-04 ENCOUNTER — Telehealth: Payer: Self-pay | Admitting: Family Medicine

## 2015-11-01 IMAGING — CR DG KNEE COMPLETE 4+V*R*
4 series · 4 of 4 positions shown · non-contrast
Comparison: None.

CLINICAL DATA: Knee pain.  No trauma history submitted.

EXAM:
RIGHT KNEE - COMPLETE 4+ VIEW

[AP]
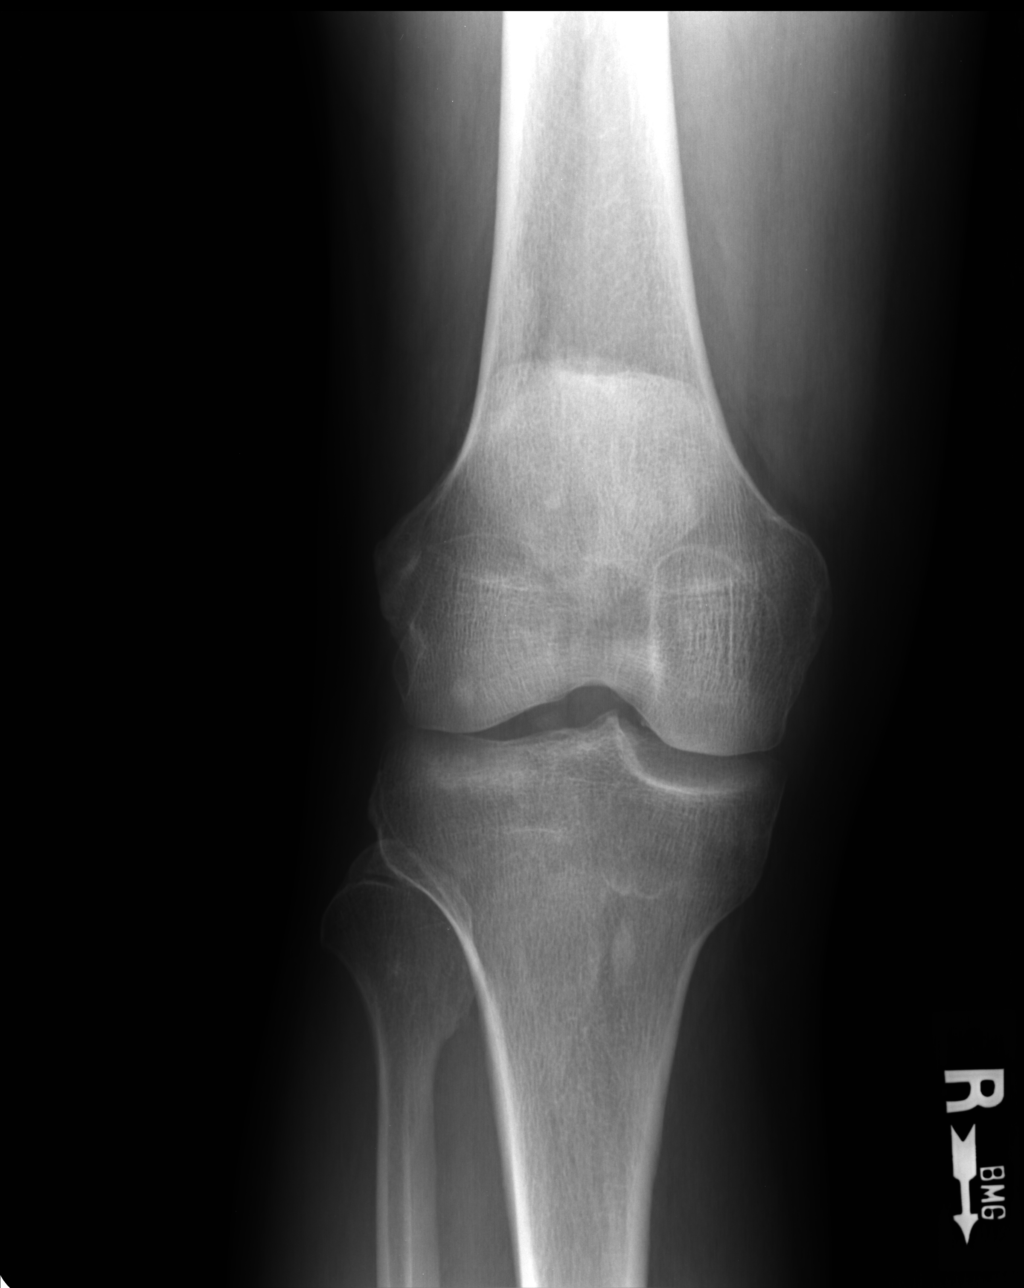

[ap axial]
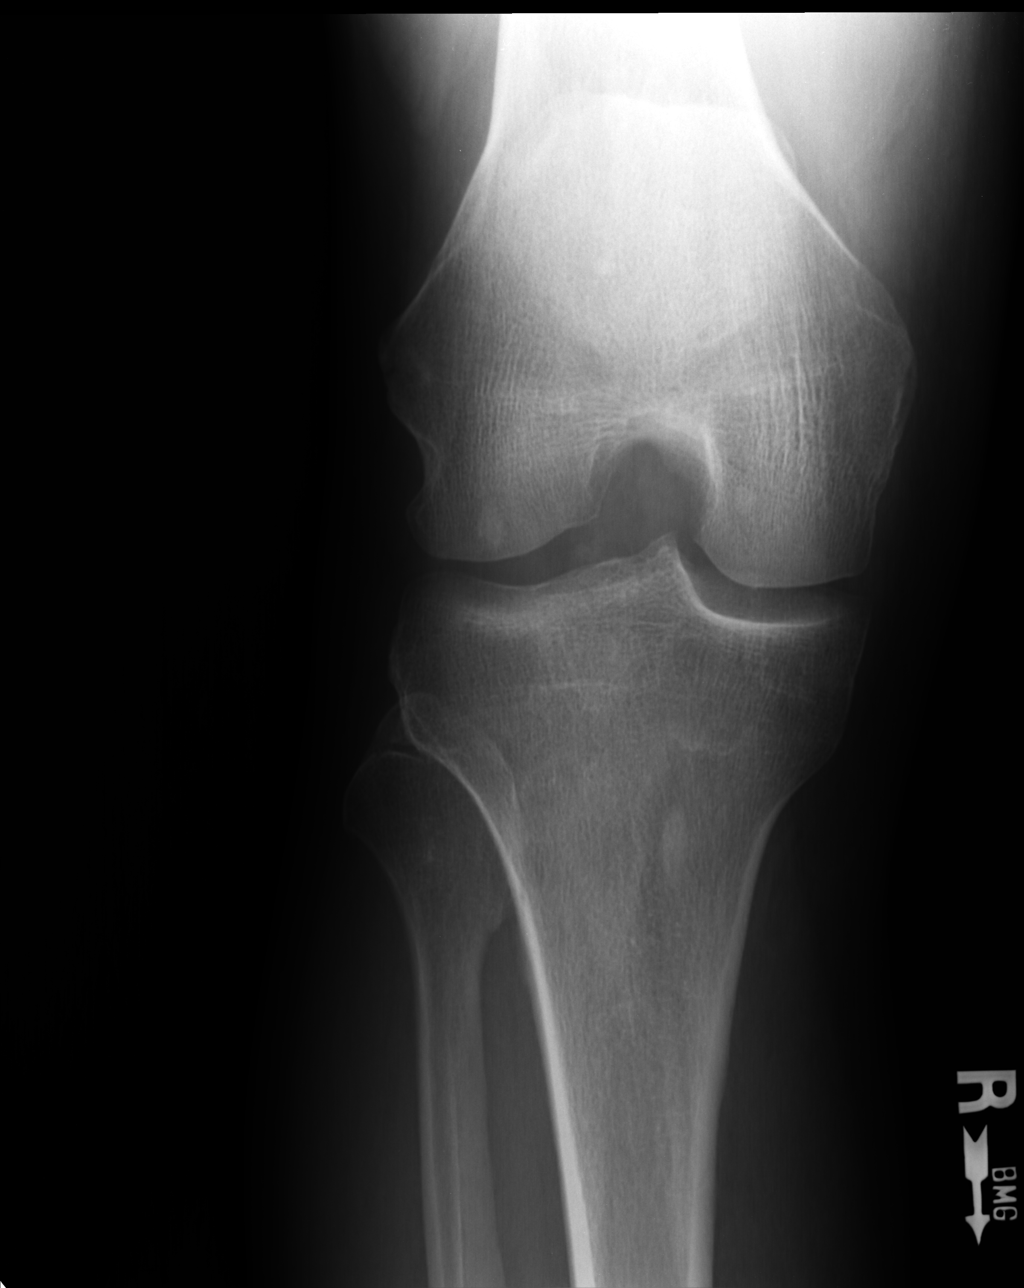

[lateral]
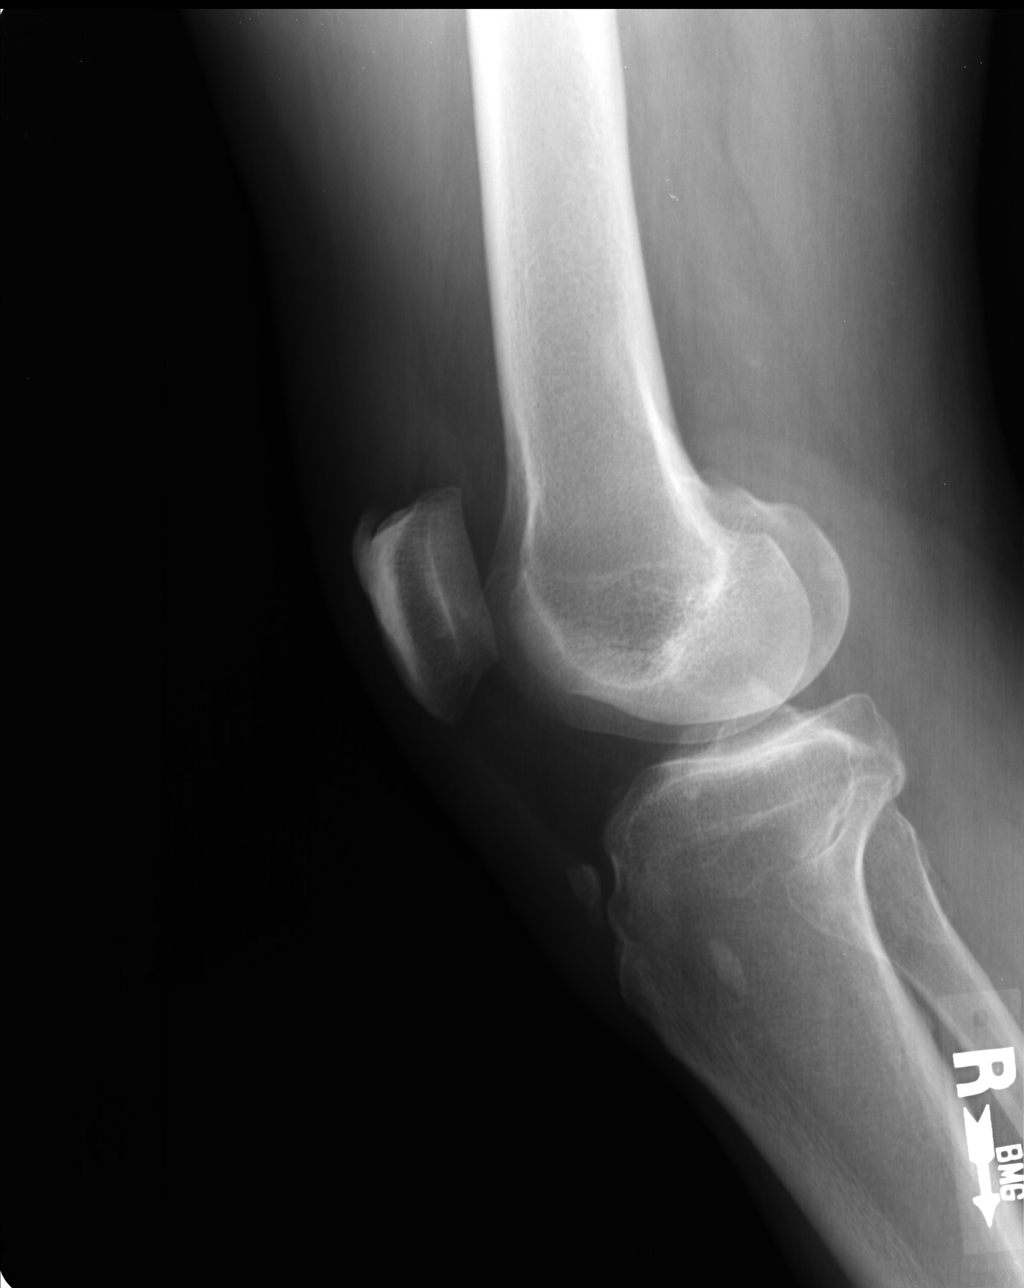

[sunrise]
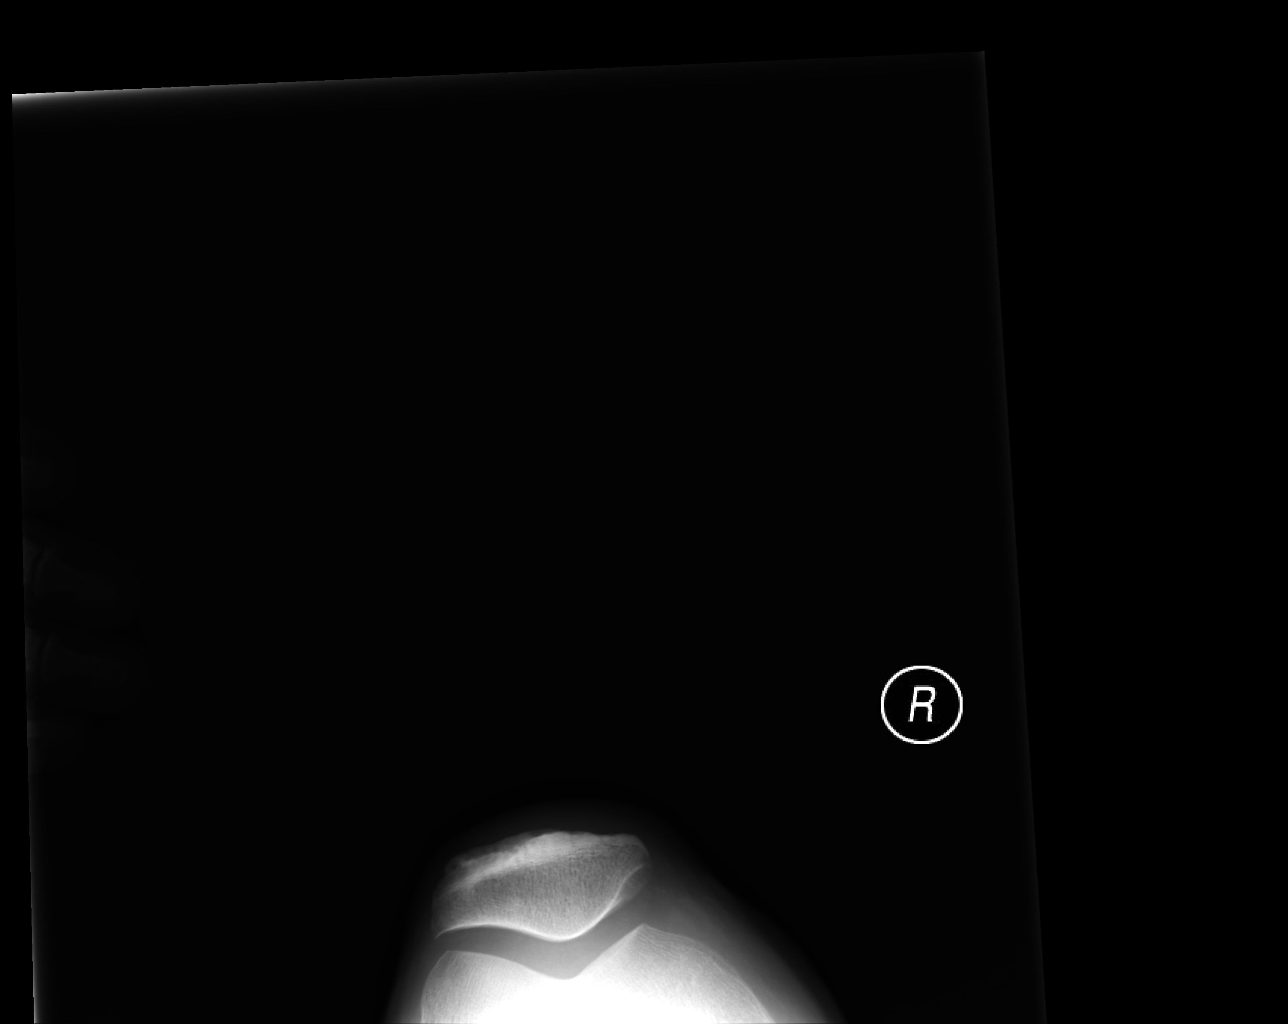

[4 of 4 positions shown; findings below may reference images not displayed]

FINDINGS: No acute fracture or dislocation. Joint spaces are maintained for
age. Enthesopathic change at the quadriceps insertion. No joint
effusion. Multiple sclerotic foci including within the distal femur
and proximal tibia are likely bone islands.
IMPRESSION: No acute osseous abnormality.

## 2019-06-16 ENCOUNTER — Encounter: Payer: Self-pay | Admitting: Gastroenterology

## 2019-06-28 ENCOUNTER — Ambulatory Visit (AMBULATORY_SURGERY_CENTER): Payer: Self-pay | Admitting: *Deleted

## 2019-06-28 ENCOUNTER — Other Ambulatory Visit: Payer: Self-pay

## 2019-06-28 VITALS — Temp 96.9°F | Ht 67.0 in | Wt 194.0 lb

## 2019-06-28 DIAGNOSIS — Z86010 Personal history of colonic polyps: Secondary | ICD-10-CM

## 2019-06-28 DIAGNOSIS — Z01818 Encounter for other preprocedural examination: Secondary | ICD-10-CM

## 2019-06-28 MED ORDER — SUPREP BOWEL PREP KIT 17.5-3.13-1.6 GM/177ML PO SOLN: 1.0000 | Freq: Once | ORAL | 0 refills | Status: AC

## 2019-06-28 NOTE — Progress Notes (Signed)
No egg or soy allergy known to patient  No issues with past sedation with any surgeries  or procedures, no intubation problems  No diet pills per patient No home 02 use per patient  No blood thinners per patient  Pt denies issues with constipation  No A fib or A flutter  EMMI video sent to pt's e mail  Suprep $15 coupon   Due to the COVID-19 pandemic we are asking patients to follow these guidelines. Please only bring one care partner. Please be aware that your care partner may wait in the car in the parking lot or if they feel like they will be too hot to wait in the car, they may wait in the lobby on the 4th floor. All care partners are required to wear a mask the entire time (we do not have any that we can provide them), they need to practice social distancing, and we will do a Covid check for all patient's and care partners when you arrive. Also we will check their temperature and your temperature. If the care partner waits in their car they need to stay in the parking lot the entire time and we will call them on their cell phone when the patient is ready for discharge so they can bring the car to the front of the building. Also all patient's will need to wear a mask into building.

## 2019-07-09 ENCOUNTER — Other Ambulatory Visit: Payer: Self-pay | Admitting: Gastroenterology

## 2019-07-09 ENCOUNTER — Ambulatory Visit (INDEPENDENT_AMBULATORY_CARE_PROVIDER_SITE_OTHER): Payer: BC Managed Care – PPO

## 2019-07-09 ENCOUNTER — Other Ambulatory Visit: Payer: Self-pay

## 2019-07-09 DIAGNOSIS — Z1159 Encounter for screening for other viral diseases: Secondary | ICD-10-CM

## 2019-07-10 LAB — SARS CORONAVIRUS 2 (TAT 6-24 HRS): SARS Coronavirus 2: NEGATIVE

## 2019-07-13 ENCOUNTER — Encounter: Payer: Self-pay | Admitting: Gastroenterology

## 2019-07-13 ENCOUNTER — Ambulatory Visit (AMBULATORY_SURGERY_CENTER): Payer: BC Managed Care – PPO | Admitting: Gastroenterology

## 2019-07-13 ENCOUNTER — Other Ambulatory Visit: Payer: Self-pay

## 2019-07-13 VITALS — BP 137/85 | HR 80 | Temp 95.5°F | Resp 13 | Ht 67.0 in | Wt 194.0 lb

## 2019-07-13 DIAGNOSIS — D125 Benign neoplasm of sigmoid colon: Secondary | ICD-10-CM

## 2019-07-13 DIAGNOSIS — Z86010 Personal history of colonic polyps: Secondary | ICD-10-CM | POA: Diagnosis not present

## 2019-07-13 DIAGNOSIS — D124 Benign neoplasm of descending colon: Secondary | ICD-10-CM

## 2019-07-13 DIAGNOSIS — D123 Benign neoplasm of transverse colon: Secondary | ICD-10-CM

## 2019-07-13 MED ORDER — SODIUM CHLORIDE 0.9 % IV SOLN: 500.0000 mL | Freq: Once | INTRAVENOUS | Status: DC

## 2019-07-13 NOTE — Progress Notes (Signed)
Pt's states no medical or surgical changes since previsit or office visit. 

## 2019-07-13 NOTE — Op Note (Signed)
[place] Patient Name: John Khan Procedure Date: 07/13/2019 9:52 AM MRN: [id number] Endoscopist: Ladene Artist , MD Age: 59 Referring MD:  Date of Birth: 11-Mar-1961 Gender: Male Account #: [id number] Procedure:                Colonoscopy Indications:              Surveillance: Personal history of adenomatous                            polyps on last colonoscopy > 5 years ago Medicines:                Monitored Anesthesia Care Procedure:                Pre-Anesthesia Assessment:                           - Prior to the procedure, a History and Physical                            was performed, and patient medications and                            allergies were reviewed. The patient's tolerance of                            previous anesthesia was also reviewed. The risks                            and benefits of the procedure and the sedation                            options and risks were discussed with the patient.                            All questions were answered, and informed consent                            was obtained. Prior Anticoagulants: The patient has                            taken no previous anticoagulant or antiplatelet                            agents. ASA Grade Assessment: II - A patient with                            mild systemic disease. After reviewing the risks                            and benefits, the patient was deemed in                            satisfactory condition to undergo the procedure.  After obtaining informed consent, the colonoscope                            was passed under direct vision. Throughout the                            procedure, the patient's blood pressure, pulse, and                            oxygen saturations were monitored continuously. The                            Colonoscope was introduced through the anus and                            advanced to the the cecum,  identified by                            appendiceal orifice and ileocecal valve. The                            ileocecal valve, appendiceal orifice, and rectum                            were photographed. The quality of the bowel                            preparation was excellent. The colonoscopy was                            performed without difficulty. The patient tolerated                            the procedure well. Scope In: 10:03:26 AM Scope Out: 10:19:40 AM Scope Withdrawal Time: 0 hours 14 minutes 35 seconds  Total Procedure Duration: 0 hours 16 minutes 14 seconds  Findings:                 The perianal and digital rectal examinations were                            normal.                           A 7 mm polyp was found in the hepatic flexure. The                            polyp was sessile. The polyp was removed with a                            cold snare. Resection was complete, but the polyp                            tissue was not retrieved.  Four sessile polyps were found in the sigmoid colon                            (2) and descending colon (3). The polyps were 6 to                            7 mm in size. These polyps were removed with a cold                            snare. Resection and retrieval were complete.                           Internal hemorrhoids were found during                            retroflexion. The hemorrhoids were small and Grade                            I (internal hemorrhoids that do not prolapse).                           The exam was otherwise without abnormality on                            direct and retroflexion views. Complications:            No immediate complications. Estimated blood loss:                            None. Estimated Blood Loss:     Estimated blood loss: none. Impression:               - One 7 mm polyp at the hepatic flexure, removed                            with a cold  snare. Complete resection. Polyp tissue                            not retrieved.                           - Five 6 to 7 mm polyps in the sigmoid colon and in                            the descending colon, removed with a cold snare.                            Resected and retrieved.                           - Internal hemorrhoids.                           - The examination was otherwise normal on direct  and retroflexion views. Recommendation:           - Repeat colonoscopy in 3 years for surveillance.                           - Patient has a contact number available for                            emergencies. The signs and symptoms of potential                            delayed complications were discussed with the                            patient. Return to normal activities tomorrow.                            Written discharge instructions were provided to the                            patient.                           - Resume previous diet.                           - Continue present medications.                           - Await pathology results. Ladene Artist, MD 07/13/2019 10:27:26 AM This report has been signed electronically.

## 2019-07-13 NOTE — Patient Instructions (Signed)
Please read handouts provided. Continue present medications. Await pathology results.        YOU HAD AN ENDOSCOPIC PROCEDURE TODAY AT THE [facility]:   Refer to the procedure report that was given to you for any specific questions about what was found during the examination.  If the procedure report does not answer your questions, please call your gastroenterologist to clarify.  If you requested that your care partner not be given the details of your procedure findings, then the procedure report has been included in a sealed envelope for you to review at your convenience later.  YOU SHOULD EXPECT: Some feelings of bloating in the abdomen. Passage of more gas than usual.  Walking can help get rid of the air that was put into your GI tract during the procedure and reduce the bloating. If you had a lower endoscopy (such as a colonoscopy or flexible sigmoidoscopy) you may notice spotting of blood in your stool or on the toilet paper. If you underwent a bowel prep for your procedure, you may not have a normal bowel movement for a few days.  Please Note:  You might notice some irritation and congestion in your nose or some drainage.  This is from the oxygen used during your procedure.  There is no need for concern and it should clear up in a day or so.  SYMPTOMS TO REPORT IMMEDIATELY:   Following lower endoscopy (colonoscopy or flexible sigmoidoscopy):  Excessive amounts of blood in the stool  Significant tenderness or worsening of abdominal pains  Swelling of the abdomen that is new, acute  Fever of 100F or higher    For urgent or emergent issues, a gastroenterologist can be reached at any hour by calling [phone].   DIET:  We do recommend a small meal at first, but then you may proceed to your regular diet.  Drink plenty of fluids but you should avoid alcoholic beverages for 24 hours.  ACTIVITY:  You should plan to take it easy for the rest of today and you should NOT  DRIVE or use heavy machinery until tomorrow (because of the sedation medicines used during the test).    FOLLOW UP: Our staff will call the number listed on your records 48-72 hours following your procedure to check on you and address any questions or concerns that you may have regarding the information given to you following your procedure. If we do not reach you, we will leave a message.  We will attempt to reach you two times.  During this call, we will ask if you have developed any symptoms of COVID 19. If you develop any symptoms (ie: fever, flu-like symptoms, shortness of breath, cough etc.) before then, please call [phone].  If you test positive for Covid 19 in the 2 weeks post procedure, please call and report this information to us.    If any biopsies were taken you will be contacted by phone or by letter within the next 1-3 weeks.  Please call us at [phone] if you have not heard about the biopsies in 3 weeks.    SIGNATURES/CONFIDENTIALITY: You and/or your care partner have signed paperwork which will be entered into your electronic medical record.  These signatures attest to the fact that that the information above on your After Visit Summary has been reviewed and is understood.  Full responsibility of the confidentiality of this discharge information lies with you and/or your care-partner. 

## 2019-07-13 NOTE — Progress Notes (Signed)
Report given to PACU, vss 

## 2019-07-13 NOTE — Progress Notes (Signed)
Called to room to assist during endoscopic procedure.  Patient ID and intended procedure confirmed with present staff. Received instructions for my participation in the procedure from the performing physician.  

## 2019-07-15 ENCOUNTER — Telehealth: Payer: Self-pay

## 2019-07-15 ENCOUNTER — Encounter: Payer: Self-pay | Admitting: Gastroenterology

## 2019-07-15 NOTE — Telephone Encounter (Signed)
  Follow up Call-  Call back number 07/13/2019  Post procedure Call Back phone  # [phone] cell Faith  Permission to leave phone message Yes  Some recent data might be hidden     Patient questions:  Do you have a fever, pain , or abdominal swelling? No. Pain Score  0 *  Have you tolerated food without any problems? Yes.    Have you been able to return to your normal activities? Yes.    Do you have any questions about your discharge instructions: Diet   No. Medications  No. Follow up visit  No.  Do you have questions or concerns about your Care? No.  Actions: * If pain score is 4 or above: No action needed, pain <4.  1. Have you developed a fever since your procedure? no  2.   Have you had an respiratory symptoms (SOB or cough) since your procedure? no  3.   Have you tested positive for COVID 19 since your procedure no  4.   Have you had any family members/close contacts diagnosed with the COVID 19 since your procedure?  no   If yes to any of these questions please route to Joylene John, RN and Alphonsa Gin, Therapist, sports.

## 2023-05-07 ENCOUNTER — Other Ambulatory Visit (INDEPENDENT_AMBULATORY_CARE_PROVIDER_SITE_OTHER): Payer: Self-pay | Admitting: Nurse Practitioner

## 2023-05-07 DIAGNOSIS — I739 Peripheral vascular disease, unspecified: Secondary | ICD-10-CM

## 2023-05-09 ENCOUNTER — Ambulatory Visit (INDEPENDENT_AMBULATORY_CARE_PROVIDER_SITE_OTHER): Payer: BC Managed Care – PPO | Admitting: Vascular Surgery

## 2023-05-09 ENCOUNTER — Ambulatory Visit (INDEPENDENT_AMBULATORY_CARE_PROVIDER_SITE_OTHER): Payer: BC Managed Care – PPO

## 2023-05-09 ENCOUNTER — Encounter (INDEPENDENT_AMBULATORY_CARE_PROVIDER_SITE_OTHER): Payer: Self-pay | Admitting: Vascular Surgery

## 2023-05-09 VITALS — BP 158/92 | HR 68 | Resp 18 | Ht 67.0 in | Wt 216.2 lb

## 2023-05-09 DIAGNOSIS — E785 Hyperlipidemia, unspecified: Secondary | ICD-10-CM | POA: Diagnosis not present

## 2023-05-09 DIAGNOSIS — E119 Type 2 diabetes mellitus without complications: Secondary | ICD-10-CM

## 2023-05-09 DIAGNOSIS — I10 Essential (primary) hypertension: Secondary | ICD-10-CM

## 2023-05-09 DIAGNOSIS — I739 Peripheral vascular disease, unspecified: Secondary | ICD-10-CM

## 2023-05-09 NOTE — Progress Notes (Unsigned)
Patient ID: John Khan, male   DOB: [birth year], 62 y.o.   MRN: [id number]  Chief Complaint  Patient presents with   New Patient (Initial Visit)    URGENT: np. ABI + consult. claudication. miller, mark    HPI John Khan is a 62 y.o. male.  I am asked to see the patient by Dr. Hyacinth Meeker for evaluation of occasion symptoms in the hip, buttock, and thighs bilaterally.  This has been a progressive problem over many months.  No rest pain, ulceration, or gangrenous changes.  Both lower extremities are affected roughly equally.  He notices that with walking moderate distances he has to stop and rest due to the pain in his legs.  Hills, inclines, or stairs or a faster pace of walking make this distance much shorter. ***   Past Medical History:  Diagnosis Date   Allergy    spring with pollen    Diabetes mellitus without complication (HCC)    GERD (gastroesophageal reflux disease)    occ with diet- no meds- uses milk to relieve discomfort    Hx of adenomatous colonic polyps    Hyperlipidemia    Hypertension     Past Surgical History:  Procedure Laterality Date   CARDIAC CATHETERIZATION     COLONOSCOPY     POLYPECTOMY     Right hand Fractured and pinned       Family History  Problem Relation Age of Onset   Diabetes Brother    Diabetes Maternal Uncle    Cancer Paternal Uncle    Heart disease Paternal Uncle    Diabetes Father    Diabetes Paternal Uncle    Colon cancer Neg Hx    Colon polyps Neg Hx    Esophageal cancer Neg Hx    Rectal cancer Neg Hx    Stomach cancer Neg Hx      Social History   Tobacco Use   Smoking status: Every Day    Current packs/day: 2.00    Types: Cigarettes   Smokeless tobacco: Never  Substance Use Topics   Alcohol use: No    Comment: Once or twice a year- rare    Drug use: No     Allergies  Allergen Reactions   Benzalkonium Chloride Rash   Neosporin [Neomycin-Polymyxin B Gu] Rash    Current Outpatient Medications   Medication Sig Dispense Refill   aspirin 81 MG tablet Take 81 mg by mouth daily.       etodolac (LODINE) 400 MG tablet Take 400 mg by mouth 2 (two) times daily.     glipiZIDE (GLUCOTROL XL) 10 MG 24 hr tablet Take 1 tablet by mouth daily.     losartan (COZAAR) 50 MG tablet Take 50 mg by mouth daily.     rosuvastatin (CRESTOR) 10 MG tablet Take 1 tablet (10 mg total) by mouth daily. 30 tablet 5   terbinafine (LAMISIL) 250 MG tablet Take 250 mg by mouth daily. Will complete this med 07-01-2019     amLODipine (NORVASC) 10 MG tablet TAKE ONE TABLET BY MOUTH ONCE DAILY FOR HIGH BLOOD PRESSURE (Patient not taking: Reported on 05/09/2023)     Butenafine HCl (MENTAX) 1 % cream Apply 30 g topically 2 (two) times daily. (Patient not taking: Reported on 05/09/2023) 24 g 3   diclofenac (VOLTAREN) 75 MG EC tablet Take 1 tablet (75 mg total) by mouth 2 (two) times daily. Use as needed for knee pain (Patient not taking: Reported on 05/09/2023) 30 tablet  0   fish oil-omega-3 fatty acids 1000 MG capsule Take 2 g by mouth daily.   (Patient not taking: Reported on 05/09/2023)     HYDROcodone-acetaminophen (NORCO/VICODIN) 5-325 MG per tablet Take 1 tablet by mouth every 8 (eight) hours as needed. (Patient not taking: Reported on 05/09/2023) 12 tablet 0   irbesartan (AVAPRO) 150 MG tablet Take 1 tablet (150 mg total) by mouth daily. (Patient not taking: Reported on 05/09/2023) 30 tablet 3   metFORMIN (GLUCOPHAGE) 500 MG tablet Take 1 tablet (500 mg total) by mouth daily with breakfast. (Patient not taking: Reported on 05/09/2023) 30 tablet 2   metFORMIN (GLUCOPHAGE-XR) 500 MG 24 hr tablet START WITH 1 TABLET THEN EACH WEEK ADD ANOTHER TABLET MAX 4 TABLETS DAILY (Patient not taking: Reported on 05/09/2023)     Multiple Vitamins-Minerals (MULTIVITAMIN WITH MINERALS) tablet Take 1 tablet by mouth daily.   (Patient not taking: Reported on 05/09/2023)     omega-3 acid ethyl esters (LOVAZA) 1 g capsule Take by mouth 2 (two) times  daily. (Patient not taking: Reported on 05/09/2023)     rosuvastatin (CRESTOR) 20 MG tablet Take 20 mg by mouth daily. (Patient not taking: Reported on 05/09/2023)     Vitamin D, Ergocalciferol, (DRISDOL) 1.25 MG (50000 UNIT) CAPS capsule Take 50,000 Units by mouth once a week. (Patient not taking: Reported on 05/09/2023)     No current facility-administered medications for this visit.      REVIEW OF SYSTEMS (Negative unless checked)  Constitutional: [] Weight loss  [] Fever  [] Chills Cardiac: [] Chest pain   [] Chest pressure   [] Palpitations   [] Shortness of breath when laying flat   [] Shortness of breath at rest   [] Shortness of breath with exertion. Vascular:  [x] Pain in legs with walking   [] Pain in legs at rest   [] Pain in legs when laying flat   [x] Claudication   [] Pain in feet when walking  [] Pain in feet at rest  [] Pain in feet when laying flat   [] History of DVT   [] Phlebitis   [] Swelling in legs   [] Varicose veins   [] Non-healing ulcers Pulmonary:   [] Uses home oxygen   [] Productive cough   [] Hemoptysis   [] Wheeze  [] COPD   [] Asthma Neurologic:  [] Dizziness  [] Blackouts   [] Seizures   [] History of stroke   [] History of TIA  [] Aphasia   [] Temporary blindness   [] Dysphagia   [] Weakness or numbness in arms   [] Weakness or numbness in legs Musculoskeletal:  [] Arthritis   [] Joint swelling   [x] Joint pain   [] Low back pain Hematologic:  [] Easy bruising  [] Easy bleeding   [] Hypercoagulable state   [] Anemic  [] Hepatitis Gastrointestinal:  [] Blood in stool   [] Vomiting blood  [x] Gastroesophageal reflux/heartburn   [] Abdominal pain Genitourinary:  [] Chronic kidney disease   [] Difficult urination  [] Frequent urination  [] Burning with urination   [] Hematuria Skin:  [] Rashes   [] Ulcers   [] Wounds Psychological:  [] History of anxiety   []  History of major depression.    Physical Exam BP (!) 158/92 (BP Location: Right Wrist)   Pulse 68   Resp 18   Ht 5\' 7"  (1.702 m)   Wt 216 lb 2.6 oz (98 kg)    BMI 33.86 kg/m  Gen:  WD/WN, NAD Head: [place]/AT, No temporalis wasting.  Ear/Nose/Throat: Hearing grossly intact, nares w/o erythema or drainage, oropharynx w/o Erythema/Exudate Eyes: Conjunctiva clear, sclera non-icteric  Neck: trachea midline.  No JVD.  Pulmonary:  Good air movement, respirations not labored, no use  of accessory muscles  Cardiac: RRR, no JVD Vascular:  Vessel Right Left  Radial Palpable Palpable                          DP 1+ 2+  PT 2+ 2+   Gastrointestinal:. No masses, surgical incisions, or scars. Musculoskeletal: M/S 5/5 throughout.  Extremities without ischemic changes.  No deformity or atrophy. No edema. Neurologic: Sensation grossly intact in extremities.  Symmetrical.  Speech is fluent. Motor exam as listed above. Psychiatric: Judgment intact, Mood & affect appropriate for pt's clinical situation. Dermatologic: No rashes or ulcers noted.  No cellulitis or open wounds.    Radiology No results found.  Labs No results found for this or any previous visit (from the past 2160 hour(s)).  Assessment/Plan:  No problem-specific Assessment & Plan notes found for this encounter.      Festus Barren 05/09/2023, 11:36 AM   This note was created with Dragon medical transcription system.  Any errors from dictation are unintentional.

## 2023-05-12 DIAGNOSIS — I739 Peripheral vascular disease, unspecified: Secondary | ICD-10-CM | POA: Insufficient documentation

## 2023-05-12 NOTE — Assessment & Plan Note (Signed)
blood pressure control important in reducing the progression of atherosclerotic disease. On appropriate oral medications.  

## 2023-05-12 NOTE — Assessment & Plan Note (Signed)
To evaluate for possible cause of his symptoms, noninvasive studies were performed today.  His ABIs were 1.01 on the right and 1.12 on the left with triphasic waveforms and digital pressures of over 100 bilaterally. I had a long discussion with the patient and his wife today.  Given these findings, the likelihood that this is arterial insufficiency is fairly low.  These are studies at rest and it is possible that he has a drop in his ABI with activity, but the yield for doing invasive arterial testing at this point is low enough that I think I would pursue neurogenic claudication as the most likely diagnosis at this point.  If his neurologic workup is negative, we can consider an angiogram to look for aortoiliac disease that could be causing symptoms, but again I think the likelihood of this is significantly smaller than neurogenic claudication.  He and his wife voiced their understanding and will contact our office if his neurogenic workup is unrevealing.

## 2023-05-12 NOTE — Assessment & Plan Note (Signed)
lipid control important in reducing the progression of atherosclerotic disease. Continue statin therapy  

## 2023-05-12 NOTE — Assessment & Plan Note (Signed)
blood glucose control important in reducing the progression of atherosclerotic disease. Also, involved in wound healing. On appropriate medications.  

## 2023-05-19 ENCOUNTER — Other Ambulatory Visit: Payer: Self-pay | Admitting: Internal Medicine

## 2023-05-19 DIAGNOSIS — G959 Disease of spinal cord, unspecified: Secondary | ICD-10-CM

## 2023-05-24 ENCOUNTER — Ambulatory Visit
Admission: RE | Admit: 2023-05-24 | Discharge: 2023-05-24 | Disposition: A | Discharge status: Home / Self Care | Payer: BC Managed Care – PPO | Source: Ambulatory Visit | Attending: Internal Medicine | Admitting: Internal Medicine

## 2023-05-24 DIAGNOSIS — G959 Disease of spinal cord, unspecified: Secondary | ICD-10-CM | POA: Insufficient documentation

## 2023-10-13 ENCOUNTER — Telehealth: Payer: Self-pay | Admitting: Neurology

## 2023-10-13 NOTE — Telephone Encounter (Signed)
 Note: for tomorrows visit   Patients wife is adamant to get spinal tap results from 1991 that Dr. Fredericka James did in this office. The results were needed to determine treatment for this patient. Since the patient was getting better Dr. Darlys Eland said let it ride and if we need to revisit we will but never told them the results. So the results would have determined if her were to be treated for Carlette Cheers or something else.   John Khan stated we do not have the records and the patient checked with the hospital and they don't have the records because the spinal tap was done in the office,  Not sure how to help this patient with their request. He will be here tomorrow for his visit.

## 2023-10-14 ENCOUNTER — Encounter: Payer: Self-pay | Admitting: Neurology

## 2023-10-14 ENCOUNTER — Ambulatory Visit: Payer: BC Managed Care – PPO | Admitting: Neurology

## 2023-10-14 VITALS — BP 155/86 | HR 84 | Ht 67.5 in | Wt 216.4 lb

## 2023-10-14 DIAGNOSIS — G1229 Other motor neuron disease: Secondary | ICD-10-CM

## 2023-10-14 DIAGNOSIS — Z9289 Personal history of other medical treatment: Secondary | ICD-10-CM

## 2023-10-14 DIAGNOSIS — M79605 Pain in left leg: Secondary | ICD-10-CM | POA: Diagnosis not present

## 2023-10-14 DIAGNOSIS — R292 Abnormal reflex: Secondary | ICD-10-CM | POA: Diagnosis not present

## 2023-10-14 DIAGNOSIS — R270 Ataxia, unspecified: Secondary | ICD-10-CM | POA: Diagnosis not present

## 2023-10-14 DIAGNOSIS — M79604 Pain in right leg: Secondary | ICD-10-CM

## 2023-10-14 DIAGNOSIS — R269 Unspecified abnormalities of gait and mobility: Secondary | ICD-10-CM

## 2023-10-14 NOTE — Progress Notes (Signed)
 [facility]    Provider:  Dr Tresia Fruit Requesting Provider: Elna Haggis, MD Primary Care Provider:  Linard Reno, MD  CC:  leg weaknedd  HPI:  John Khan is a 63 y.o. male here as requested by Elna Haggis, MD for hx of GBS. has Diabetes (HCC); HTN (hypertension); Hyperlipidemia; and Claudication (HCC) on their problem list.  He is having trouble walkng. Ongoing for several years. Started in his hips or lower back, he thought he had problems in his hips, his hips hurt and when he walks it radiates to the thighs, he stops and waits for the pain to go away and if it continues walking his legs hurt and he can't stretch his legs too far. When he stops he loses his balance like a drunk and loses control of his legs, like a drunk. He got up one morning he slept wrong and as the day went on the elbow started hurting and then the shoulder hurt and couldn;t pick his arm up and that went on a few days and went away. A few nights later he slept on his other side and his arm felt weak again and he took ibuprofen for arm pain and it resolved when he woke up it was better and over a coupe days it got better. The arms is different than the legs. His feet are numb and stay cold and numb but col to the touch. He had a vascular evaluation with was negative. He still smokes but the pulmonologist report was "excellent". He smokes a few packs a day since the age of 84. Pain starts in the hips, front and back and the knees get weak. Going down the steps is tricky because of balance. All starts at the hips. He has a knot on the back of the neck. Wife says he limps when he walks.   Reviewed notes, labs and imaging from outside physicians, which showed:  FINDINGS: Segmentation: Conventional numbering is assumed with 5 non-rib-bearing, lumbar type vertebral bodies.   Alignment:  Normal.   Vertebrae: No fracture, evidence of discitis, or suspicious bone lesion. Modic type 1 degenerative  endplate marrow signal changes at L4-5.   Conus medullaris and cauda equina: Conus extends to the T12-L1 level. Conus and cauda equina appear normal.   Paraspinal and other soft tissues: Unremarkable.   Disc levels:   T12-L1:  Normal.   L1-L2:  Small central disc protrusion.   L2-L3:  Normal.   L3-L4: Small disc bulge and mild bilateral facet arthropathy. No spinal canal stenosis or neural foraminal narrowing.   L4-L5: Disc bulge and facet arthropathy results in moderate bilateral neural foraminal narrowing. No spinal canal stenosis.   L5-S1: Small central disc protrusion and mild bilateral facet arthropathy. No spinal canal stenosis or significant neural foraminal narrowing.   IMPRESSION: Multilevel lumbar spondylosis, worst at L4-5 where there is moderate bilateral neural foraminal narrowing. No spinal canal stenosis.     Latest Ref Rng & Units 10/12/2014    4:48 PM 08/11/2013    6:22 PM 12/09/2012    6:01 PM  CBC  WBC 4.6 - 10.2 K/uL 11.4  8.6  10.5   Hemoglobin 14.1 - 18.1 g/dL 09.8  11.9  14.7   Hematocrit 43.5 - 53.7 % 47.3  44.3  44.3       Latest Ref Rng & Units 09/27/2014    3:00 PM 08/11/2013    6:10 PM 12/09/2012    5:53 PM  CMP  Glucose 65 -  99 mg/dL 295  621  308   BUN 6 - 24 mg/dL 12  14  14    Creatinine 0.76 - 1.27 mg/dL 6.57  8.46  9.62   Sodium 134 - 144 mmol/L 143  142  141   Potassium 3.5 - 5.2 mmol/L 4.1  4.2  4.3   Chloride 97 - 108 mmol/L 103  102  103   CO2 18 - 29 mmol/L 24  20  29    Calcium  8.7 - 10.2 mg/dL 9.6  9.5  9.5   Total Protein 6.0 - 8.5 g/dL 6.5  6.7  7.3   Total Bilirubin 0.0 - 1.2 mg/dL 0.4  0.6  0.8   Alkaline Phos 39 - 117 IU/L 69  66  66   AST 0 - 40 IU/L 16  19  24    ALT 0 - 44 IU/L 17  27  32      Review of Systems: Patient complains of symptoms per HPI as well as the following symptoms none. Pertinent negatives and positives per HPI. All others negative.   Social History   Socioeconomic History   Marital status:  Married    Spouse name: Faith   Number of children: 1   Years of education: Not on file   Highest education level: Not on file  Occupational History   Not on file  Tobacco Use   Smoking status: Every Day    Current packs/day: 2.00    Types: Cigarettes   Smokeless tobacco: Never  Vaping Use   Vaping status: Never Used  Substance and Sexual Activity   Alcohol use: Yes    Comment: Once or twice a year- rare 2016   Drug use: No   Sexual activity: Not on file  Other Topics Concern   Not on file  Social History Narrative   Caffiene : mtn dew 6-8 cans daily   Working:  Surveyor, quantity   Lives wife, 2 dogs   Social Drivers of Corporate investment banker Strain: Low Risk  (08/19/2023)   Received from [facility]   Overall Financial Resource Strain (CARDIA)    Difficulty of Paying Living Expenses: Not very hard  Food Insecurity: No Food Insecurity (08/19/2023)   Received from [facility]   Hunger Vital Sign    Worried About Running Out of Food in the Last Year: Never true    Ran Out of Food in the Last Year: Never true  Transportation Needs: No Transportation Needs (08/19/2023)   Received from [facility] - Transportation    In the past 12 months, has lack of transportation kept you from medical appointments or from getting medications?: No    Lack of Transportation (Non-Medical): No  Physical Activity: Not on file  Stress: Not on file  Social Connections: Not on file  Intimate Partner Violence: Not on file    Family History  Problem Relation Age of Onset   Diabetes Father    Brain cancer Sister    Diabetes Brother    Diabetes Maternal Uncle    Cancer Paternal Uncle    Heart disease Paternal Uncle    Diabetes Paternal Uncle    Diabetes Maternal Grandmother    Colon cancer Neg Hx    Colon polyps Neg Hx    Esophageal cancer Neg Hx    Rectal cancer Neg Hx    Stomach cancer Neg Hx     Past Medical  History:  Diagnosis Date   Allergy    spring with pollen    Bell's palsy    L side 1991   Diabetes mellitus without complication (HCC)    GERD (gastroesophageal reflux disease)    occ with diet- no meds- uses milk to relieve discomfort / indigestion   Hx of adenomatous colonic polyps    Hyperlipidemia    Hypertension     Patient Active Problem List   Diagnosis Date Noted   Claudication (HCC) 05/12/2023   Diabetes (HCC) 08/18/2012   HTN (hypertension) 08/18/2012   Hyperlipidemia 08/18/2012    Past Surgical History:  Procedure Laterality Date   CARDIAC CATHETERIZATION     COLONOSCOPY     LUMBAR PUNCTURE     1991 Dr. Darlys Eland   POLYPECTOMY     Right hand Fractured and pinned      Current Outpatient Medications  Medication Sig Dispense Refill   aspirin 81 MG tablet Take 81 mg by mouth daily.       glipiZIDE (GLUCOTROL XL) 10 MG 24 hr tablet Take 1 tablet by mouth daily.     losartan (COZAAR) 50 MG tablet Take 50 mg by mouth daily.     Vitamin D, Ergocalciferol, (DRISDOL) 1.25 MG (50000 UNIT) CAPS capsule Take 50,000 Units by mouth once a week.     No current facility-administered medications for this visit.    Allergies as of 10/14/2023 - Review Complete 10/14/2023  Allergen Reaction Noted   Benzalkonium chloride Rash 05/16/2020   Neosporin [neomycin-polymyxin b gu] Rash 08/22/2010    Vitals: BP (!) 155/86 (BP Location: Right Arm, Patient Position: Sitting, Cuff Size: Normal) Comment: machine  Pulse 84   Ht 5' 7.5" (1.715 m)   Wt 216 lb 6.4 oz (98.2 kg)   BMI 33.39 kg/m  Last Weight:  Wt Readings from Last 1 Encounters:  10/14/23 216 lb 6.4 oz (98.2 kg)   Last Height:   Ht Readings from Last 1 Encounters:  10/14/23 5' 7.5" (1.715 m)     Physical exam: Exam: Gen: NAD, conversant, well nourised, obese, well groomed                     CV: RRR, no MRG. No Carotid Bruits. No peripheral edema, warm, nontender Eyes: Conjunctivae clear without exudates or  hemorrhage  Neuro: Detailed Neurologic Exam  Speech:    Speech is normal; fluent and spontaneous with normal comprehension.  Cognition:    The patient is oriented to person, place, and time;     recent and remote memory intact;     language fluent;     normal attention, concentration,     fund of knowledge Cranial Nerves:    The pupils are equal, round, and reactive to light. The fundi are normal and spontaneous venous pulsations are present. Visual fields are full to finger confrontation. Extraocular movements are intact. Trigeminal sensation is intact and the muscles of mastication are normal. The face is symmetric. The palate elevates in the midline. Hearing intact. Voice is normal. Shoulder shrug is normal. The tongue has normal motion without fasciculations.   Coordination:    Normal finger to nose and heel to shin. Normal rapid alternating movements.   Gait: Can stand without using his hands. Can heel and toe walk. Imbalance on tandem.  Motor Observation:    No asymmetry, no atrophy, and no involuntary movements noted. Tone:    Normal muscle tone.    Posture:    Posture is normal. normal  erect    Strength:    Strength is V/V in the upper and lower limbs.      Sensation: +Romberg. Intact to vibration and pin prick, decreased distally in the feet to cold mildly in a gradient distribution.      Reflex Exam:  DTR's:    1+ AJs. Patellars 3+. 2+ uppers.  Toes:    The toes are downgoing bilaterally.   Clonus:    Clonus is absent.    Assessment/Plan:    Could be myelopathic but no brisk reflexes because of a hx of GBS in fact I am surprised he has reflexes in the AJs given his hx of diabetes and smoking so he may have some increase reflexes.   After walking point tenderness lateral hip, strength is still good. Trochanteric bursa? Or tendon? Hurts more when walking or lifting legs up. Recommend MRI of the area to see if there is inflammation, could it be bursitis(as he  walks) emg/ncs however a distal neuropathy would not start in his hips. I think this is very unlikely to be neuropathic. He has hip pain on walking points to where the bursa is on the hip possibly from rubbing of the ligament across the bursa.   MRI cervical spine and thoracic spine to evaluate for myelopaty due to abnormal reflexes/spasticity causing walking problems possibly?  Unusual; case, hen walking he appears to become ataxic and has to stop. May be pain need to rule out neuromuscular or spinal causes such as myelopathy. More imaging and emg/ncs  Orders Placed This Encounter  Procedures   MR CERVICAL SPINE WO CONTRAST   MR THORACIC SPINE WO CONTRAST   NCV with EMG(electromyography)   No orders of the defined types were placed in this encounter.   Cc: Elna Haggis, MD,  Linard Reno, MD  Aldona Amel, MD  [facility] [address] [place], [place] [address]   I spent 90 minutes of face-to-face and non-face-to-face time with patient on the  1. Pain in both lower extremities   2. Ataxia   3. Abnormal gait   4. History of sensory changes   5. Abnormal DTR (deep tendon reflex)   6. Upper motor neuron lesion (HCC)    diagnosis.  This included previsit chart review, lab review, study review, order entry, electronic health record documentation, patient education on the different diagnostic and therapeutic options, counseling and coordination of care, risks and benefits of management, compliance, or risk factor reduction  Phone [phone] Fax [phone]

## 2023-10-14 NOTE — Patient Instructions (Signed)
 MRI cervical spine MRI thoracic spine EMG/NCS  Electromyoneurogram Electromyoneurogram is a test to check how well your muscles and nerves are working. This procedure includes the combined use of electromyogram (EMG) and nerve conduction study (NCS). EMG is used to evaluate muscles and the nerves that control those muscles. NCS, which is also called electroneurogram, measures how well your nerves conduct electricity. The procedures should be done together to check if your muscles and nerves are healthy. If the results of the tests are abnormal, this may indicate disease or injury, such as a neuromuscular disease or peripheral nerve damage. Tell a health care provider about: Any allergies you have. All medicines you are taking, including vitamins, herbs, eye drops, creams, and over-the-counter medicines. Any bleeding problems you have. Any surgeries you have had. Any medical conditions you have. What are the risks? Generally, this is a safe procedure. However, problems may occur, including: Bleeding or bruising. Infection where the electrodes were inserted. What happens before the test? Medicines Take all of your usually prescribed medications before this testing is performed. Do not stop your blood thinners unless advised by your prescribing physician. General instructions Your health care provider may ask you to warm the limb that will be checked with warm water, hot pack, or wrapping the limb in a blanket. Do not use lotions or creams on the same day that you will be having the procedure. What happens during the test? For EMG  Your health care provider will ask you to stay in a position so that the muscle being studied can be accessed. You will be sitting or lying down. You may be given a medicine to numb the area (local anesthetic) and the skin will be disinfected. A very thin needle that has an electrode will be inserted into your muscle, one muscle at a time. Typically, multiple  muscles are evaluated during a single study. Another small electrode will be placed on your skin near the muscle. Your health care provider will ask you to continue to remain still. The electrodes will record the electrical activity of your muscles. You may see this on a monitor or hear it in the room. After your muscles have been studied at rest, your health care provider will ask you to contract or flex your muscles. The electrodes will record the electrical activity of your muscles. Your health care provider will remove the electrodes and the electrode needle when the procedure is finished. The procedure may vary among health care providers and hospitals. For NCS  An electrode that records your nerve activity (recording electrode) will be placed on your skin by the muscle that is being studied. An electrode that is used as a reference (reference electrode) will be placed near the recording electrode. A paste or gel will be applied to your skin between the recording electrode and the reference electrode. Your nerve will be stimulated with a mild shock. The speed of the nerves and strength of response is recorded by the electrodes. Your health care provider will remove the electrodes and the gel when the procedure is finished. The procedure may vary among health care providers and hospitals. What can I expect after the test? It is up to you to get your test results. Ask your health care provider, or the department that is doing the test, when your results will be ready. Your health care provider may: Give you medicines for any pain. Monitor the insertion sites to make sure that bleeding stops. You should be able to drive  yourself to and from the test. Discomfort can persist for a few hours after the test, but should be better the next day. Contact a health care provider if: You have swelling, redness, or drainage at any of the insertion sites. Summary Electromyoneurogram is a test to check  how well your muscles and nerves are working. If the results of the tests are abnormal, this may indicate disease or injury. This is a safe procedure. However, problems may occur, such as bleeding and infection. Your health care provider will do two tests to complete this procedure. One checks your muscles (EMG) and another checks your nerves (NCS). It is up to you to get your test results. Ask your health care provider, or the department that is doing the test, when your results will be ready. This information is not intended to replace advice given to you by your health care provider. Make sure you discuss any questions you have with your health care provider. Document Revised: 01/31/2021 Document Reviewed: 12/31/2020 Elsevier Patient Education  2024 [employer].

## 2023-11-05 ENCOUNTER — Ambulatory Visit

## 2023-11-05 DIAGNOSIS — R270 Ataxia, unspecified: Secondary | ICD-10-CM | POA: Diagnosis not present

## 2023-11-05 DIAGNOSIS — R292 Abnormal reflex: Secondary | ICD-10-CM | POA: Diagnosis not present

## 2023-11-05 DIAGNOSIS — R269 Unspecified abnormalities of gait and mobility: Secondary | ICD-10-CM

## 2023-11-05 DIAGNOSIS — G1229 Other motor neuron disease: Secondary | ICD-10-CM

## 2023-11-05 DIAGNOSIS — Z9289 Personal history of other medical treatment: Secondary | ICD-10-CM

## 2023-11-05 DIAGNOSIS — M79604 Pain in right leg: Secondary | ICD-10-CM

## 2023-11-05 DIAGNOSIS — M79605 Pain in left leg: Secondary | ICD-10-CM

## 2023-11-09 ENCOUNTER — Ambulatory Visit: Payer: Self-pay | Admitting: Neurology

## 2023-11-10 NOTE — Telephone Encounter (Signed)
 Spoke to wife (checked DPR) gave  MRI Thoracic and cervical spine results Wife states will relay results to patient Wife thanked me for calling

## 2023-11-10 NOTE — Telephone Encounter (Signed)
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 He also has a thoracic mri result so look for that when you call so you can give him both results. Thanks.

## 2023-11-19 NOTE — Progress Notes (Unsigned)
 [facility]    Provider:  Dr Tresia Fruit Requesting Provider: Linard Reno, MD Primary Care Provider:  Linard Reno, MD  CC:  leg weaknedd  11/20/2023: John Khan patient here with his wife: Patient alredy had emg/ncs of the lowers. Will not repeat, it was a very thorough examination. Reviewed findings: Per Dr. Mason Sole 10/01/2023:  Unstable gait with muscle weakness Bilateral leg weakness with give-away sensation and unstable gait, characterized by knees buckling and difficulty walking long distances. Examination focused on muscles from the hips down, including legs, thighs, and lower back, to assess for muscle damage. Differential diagnosis includes potential muscular disorders. Muscle strength testing was conducted in various regions to identify specific areas of weakness.   EMG & NCV Findings: Evaluation of the Left superficial peroneal sensory and the Right superficial peroneal sensory nerves showed no response (14 cm). The Left sural sensory nerve showed no response (Calf). The Right sural sensory nerve showed prolonged distal peak latency (4.9 ms) and decreased conduction velocity (Calf-Lat Mall, 29 m/s). All remaining nerves (as indicated in the following tables) were within normal limits.   EMG  Side Muscle Nerve Root Ins Act Fibs Psw Amp Dur Poly Recrt Int Deatra Face Comment  Left Gastroc Tibial S1-2 Nml Nml Nml Nml Nml 0 Nml Nml  Left AntTibialis Dp Br Peron L4-5 Nml Nml Nml Nml Nml 0 Nml Nml  Left Peroneus Long Sup Br Peron L5-S1 Nml Nml Nml Nml Nml 0 Nml Nml  Left VastusLat Femoral L2-4 Nml Nml Nml Nml Nml 0 Nml Nml  Left Iliopsoas Femoral L2-3 Nml Nml Nml Nml Nml 0 Nml Nml  Left Lumbo Parasp Up Rami L1-2 Nml Nml Nml  Left Lumbo Parasp Mid Rami L3-4 Nml Nml Nml  Left Lumbo Parasp Low Rami L5-S1 Nml Nml Nml  Right Gastroc Tibial S1-2 Nml Nml Nml Nml Nml 0 Nml Nml  Right AntTibialis Dp Br Peron L4-5 Nml Nml Nml Nml Nml 0 Nml Nml  Right Peroneus Long Sup Br Peron  L5-S1 Nml Nml Nml Nml Nml 0 Nml Nml  Right VastusLat Femoral L2-4 Nml Nml Nml Nml Nml 0 Nml Nml  Right Iliopsoas Femoral L2-3 Nml Nml Nml Nml Nml 0 Nml Nml  Right Lumbo Parasp Up Rami L1-2 Nml Nml Nml  Right Lumbo Parasp Mid Rami L3-4 Nml Nml Nml  Right Lumbo Parasp Low Rami L5-S1 Nml Nml Nml   Impression: This is an abnormal electrodiagnostic study consistent with a generalized severe polyneuropathy. There is no electrodiagnostic evidence of myopathy.   11/05/2023: MRI cervical and thoracic  STUDY DATE: 11/05/2023 ...    Unremarkable MRI scan of thoracic spine without contrast.     STUDY DATE: 11/05/2023 ...    MRI scan of cervical spine without contrast showing prominent spondylitic change at C5-6 with mild right-sided foraminal narrowing and at C6/7 with mild left-sided foraminal narrowing.Aaron Aas    MRI lumbar spine:06/03/2023   IMPRESSION: Multilevel lumbar spondylosis, worst at L4-5 where there is moderate bilateral neural foraminal narrowing. No spinal canal stenosis.  Patient complains of symptoms per HPI as well as the following symptoms: none . Pertinent negatives and positives per HPI. All others negative    HPI:  John Khan is a 63 y.o. male here as requested by Linard Reno, MD for hx of GBS. has Diabetes (HCC); HTN (hypertension); Hyperlipidemia; and Claudication (HCC) on their problem list.  He is having trouble walkng. Ongoing for several years. Started in his hips or lower back, he thought he had  problems in his hips, his hips hurt and when he walks it radiates to the thighs, he stops and waits for the pain to go away and if it continues walking his legs hurt and he can't stretch his legs too far. When he stops he loses his balance like a drunk and loses control of his legs, like a drunk. He got up one morning he slept wrong and as the day went on the elbow started hurting and then the shoulder hurt and couldn;t pick his arm up and that went on a few days and went  away. A few nights later he slept on his other side and his arm felt weak again and he took ibuprofen for arm pain and it resolved when he woke up it was better and over a coupe days it got better. The arms is different than the legs. His feet are numb and stay cold and numb but col to the touch. He had a vascular evaluation with was negative. He still smokes but the pulmonologist report was excellent. He smokes a few packs a day since the age of 54. Pain starts in the hips, front and back and the knees get weak. Going down the steps is tricky because of balance. All starts at the hips. He has a knot on the back of the neck. Wife says he limps when he walks.   Reviewed notes, labs and imaging from outside physicians, which showed:  FINDINGS: Segmentation: Conventional numbering is assumed with 5 non-rib-bearing, lumbar type vertebral bodies.   Alignment:  Normal.   Vertebrae: No fracture, evidence of discitis, or suspicious bone lesion. Modic type 1 degenerative endplate marrow signal changes at L4-5.   Conus medullaris and cauda equina: Conus extends to the T12-L1 level. Conus and cauda equina appear normal.   Paraspinal and other soft tissues: Unremarkable.   Disc levels:   T12-L1:  Normal.   L1-L2:  Small central disc protrusion.   L2-L3:  Normal.   L3-L4: Small disc bulge and mild bilateral facet arthropathy. No spinal canal stenosis or neural foraminal narrowing.   L4-L5: Disc bulge and facet arthropathy results in moderate bilateral neural foraminal narrowing. No spinal canal stenosis.   L5-S1: Small central disc protrusion and mild bilateral facet arthropathy. No spinal canal stenosis or significant neural foraminal narrowing.   IMPRESSION: Multilevel lumbar spondylosis, worst at L4-5 where there is moderate bilateral neural foraminal narrowing. No spinal canal stenosis.     Latest Ref Rng & Units 10/12/2014    4:48 PM 08/11/2013    6:22 PM 12/09/2012    6:01 PM  CBC   WBC 4.6 - 10.2 K/uL 11.4  8.6  10.5   Hemoglobin 14.1 - 18.1 g/dL 16.1  09.6  04.5   Hematocrit 43.5 - 53.7 % 47.3  44.3  44.3       Latest Ref Rng & Units 09/27/2014    3:00 PM 08/11/2013    6:10 PM 12/09/2012    5:53 PM  CMP  Glucose 65 - 99 mg/dL 409  811  914   BUN 6 - 24 mg/dL 12  14  14    Creatinine 0.76 - 1.27 mg/dL 7.82  9.56  2.13   Sodium 134 - 144 mmol/L 143  142  141   Potassium 3.5 - 5.2 mmol/L 4.1  4.2  4.3   Chloride 97 - 108 mmol/L 103  102  103   CO2 18 - 29 mmol/L 24  20  29    Calcium   8.7 - 10.2 mg/dL 9.6  9.5  9.5   Total Protein 6.0 - 8.5 g/dL 6.5  6.7  7.3   Total Bilirubin 0.0 - 1.2 mg/dL 0.4  0.6  0.8   Alkaline Phos 39 - 117 IU/L 69  66  66   AST 0 - 40 IU/L 16  19  24    ALT 0 - 44 IU/L 17  27  32      Review of Systems: Patient complains of symptoms per HPI as well as the following symptoms none. Pertinent negatives and positives per HPI. All others negative.   Social History   Socioeconomic History   Marital status: Married    Spouse name: Faith   Number of children: 1   Years of education: Not on file   Highest education level: Not on file  Occupational History   Not on file  Tobacco Use   Smoking status: Every Day    Current packs/day: 2.00    Types: Cigarettes   Smokeless tobacco: Never  Vaping Use   Vaping status: Never Used  Substance and Sexual Activity   Alcohol use: Yes    Comment: Once or twice a year- rare 2016   Drug use: No   Sexual activity: Not on file  Other Topics Concern   Not on file  Social History Narrative   Caffiene : mtn dew 6-8 cans daily   Working:  Surveyor, quantity   Lives wife, 2 dogs   Pt works    Social Drivers of Corporate investment banker Strain: Low Risk  (11/05/2023)   Received from [facility]   Overall Financial Resource Strain (CARDIA)    Difficulty of Paying Living Expenses: Not hard at all  Food Insecurity: No Food Insecurity (11/05/2023)   Received from [facility]   Hunger Vital Sign    Within the past 12 months, you worried that your food would run out before you got the money to buy more.: Never true    Within the past 12 months, the food you bought just didn't last and you didn't have money to get more.: Never true  Transportation Needs: No Transportation Needs (11/05/2023)   Received from [facility] - Transportation    In the past 12 months, has lack of transportation kept you from medical appointments or from getting medications?: No    Lack of Transportation (Non-Medical): No  Physical Activity: Not on file  Stress: Not on file  Social Connections: Not on file  Intimate Partner Violence: Not on file    Family History  Problem Relation Age of Onset   Diabetes Father    Brain cancer Sister    Diabetes Brother    Diabetes Maternal Uncle    Cancer Paternal Uncle    Heart disease Paternal Uncle    Diabetes Paternal Uncle    Diabetes Maternal Grandmother    Colon cancer Neg Hx    Colon polyps Neg Hx    Esophageal cancer Neg Hx    Rectal cancer Neg Hx    Stomach cancer Neg Hx     Past Medical History:  Diagnosis Date   Allergy    spring with pollen    Bell's palsy    L side 1991   Diabetes mellitus without complication (HCC)    GERD (gastroesophageal reflux disease)    occ with diet- no meds- uses milk to relieve discomfort / indigestion   Hx  of adenomatous colonic polyps    Hyperlipidemia    Hypertension     Patient Active Problem List   Diagnosis Date Noted   Claudication (HCC) 05/12/2023   Diabetes (HCC) 08/18/2012   HTN (hypertension) 08/18/2012   Hyperlipidemia 08/18/2012    Past Surgical History:  Procedure Laterality Date   CARDIAC CATHETERIZATION     COLONOSCOPY     LUMBAR PUNCTURE     1991 Dr. Darlys Eland   POLYPECTOMY     Right hand Fractured and pinned      Current Outpatient Medications  Medication Sig Dispense Refill   aspirin 81 MG tablet Take 81 mg  by mouth daily.       glipiZIDE (GLUCOTROL XL) 10 MG 24 hr tablet Take 1 tablet by mouth daily.     losartan (COZAAR) 50 MG tablet Take 50 mg by mouth daily.     rosuvastatin  (CRESTOR ) 10 MG tablet Take 1 tablet by mouth daily.     Vitamin D, Ergocalciferol, (DRISDOL) 1.25 MG (50000 UNIT) CAPS capsule Take 50,000 Units by mouth once a week.     No current facility-administered medications for this visit.    Allergies as of 11/20/2023 - Review Complete 11/20/2023  Allergen Reaction Noted   Benzalkonium chloride Rash 05/16/2020   Neosporin [neomycin-polymyxin b gu] Rash 08/22/2010    Vitals: BP 127/85   Pulse 77   Ht 5' 7 (1.702 m)   Wt 210 lb (95.3 kg)   BMI 32.89 kg/m  Last Weight:  Wt Readings from Last 1 Encounters:  11/20/23 210 lb (95.3 kg)   Last Height:   Ht Readings from Last 1 Encounters:  11/20/23 5' 7 (1.702 m)     Physical exam: exam is stable Exam: Gen: NAD, conversant, well nourised, obese, well groomed                     CV: No shortness of breath. Rrr.No Carotid Bruits. No peripheral edema, warm, nontender Eyes: Conjunctivae clear without exudates or hemorrhage  Neuro: exam is stable Detailed Neurologic Exam  Speech:    Speech is normal; fluent and spontaneous with normal comprehension.  Cognition:    The patient is oriented to person, place, and time;     recent and remote memory intact;     language fluent;     normal attention, concentration,     fund of knowledge Cranial Nerves:    The pupils are equal, round, and reactive to light. Visual fields are full. Extraocular movements are intact. Trigeminal sensation is intact and the muscles of mastication are normal. The face is symmetric. The palate elevates in the midline. Hearing intact. Voice is normal. Shoulder shrug is normal. The tongue has normal motion without fasciculations.   Coordination:    Normal finger to nose and heel to shin. Normal rapid alternating movements.   Gait: Can stand  without using his hands. Can heel and toe walk. Imbalance on tandem.  Motor Observation:    No asymmetry, no atrophy, and no involuntary movements noted. Tone:    Normal muscle tone.    Posture:    Posture is normal. normal erect    Strength:    Strength is V/V in the upper and lower limbs.      Sensation: +Romberg.      Reflex Exam:  DTR's:    1+ AJs. Patellars 3+. 2+ uppers.  Toes:    The toes are downgoing bilaterally.   Clonus:    Clonus is  absent.    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 Mason Sole (had emg/ncs with Dr. Mason Sole end of April which was extremely thorough and showed no muscle disease but severe distal neuropathy) due to hx of gbs, diabetes, smoking. May also be more orthopaedic because of pain in the hips especially when walking.   Recommend MRi of the brain for cerebellar ataxia(unlikely, nml FTN/HTS) or chronic microvascular ischemic disease which can also cause gait/imbalance as patient has multiple risk factors(smoking, DM)  Physical therapy for balance and gait  B12 deficiency in the past, is not supplementing, will check b12 and other bloodwork for any other cause of neuropathy in the legs/feet  MRI of the cervical and thoracic spine did not show any etiology for his lower extremity symptoms  EMG/NCS showed polyneuropathy but normal emg/muscle exam (no myositis or other muscle disease)  May also be more orthopaedic(?) because of pain in the hips especially when walking.: After walking point tenderness lateral hip, strength is still good. Trochanteric bursa? Or tendon? Hurts more when walking or lifting legs up.   Discussed:  Sensory ataxia is characterized by a loss of coordination and balance due to impaired sensory feedback from the body to the brain. Symptoms include difficulty with balance, especially in the dark or when eyes are closed, clumsiness, and a  stamping gait. Patients may also experience trouble with fine motor skills, like touching their nose with their eyes closed, and may feel a lack of vibration sense.  Key Symptoms of Sensory Ataxia:  Gait Disturbances: Difficulty walking, stumbling, or a stamping gait (feet slapping the ground).  Balance Problems: Increased difficulty with balance, especially in the dark or when closing eyes.  Loss of Proprioception: Inability to sense body position and movement, worsening when vision is impaired.  Fine Motor Difficulty: Trouble with tasks requiring fine motor skills, such as touching nose with eyes closed.  Vibration Sense Loss: Difficulty perceiving vibrations in the body.  Positive Romberg's Sign: Increased imbalance when standing with feet together and eyes closed.   Peripheral Neuropathy Peripheral neuropathy is a type of nerve damage. It affects nerves that carry signals between the spinal cord and the arms, legs, and the rest of the body (peripheral nerves). It does not affect nerves in the spinal cord or brain. In peripheral neuropathy, one nerve or a group of nerves may be damaged. Peripheral neuropathy is a broad category that includes many specific nerve disorders, like diabetic neuropathy, hereditary neuropathy, and carpal tunnel syndrome. What are the causes? This condition may be caused by: Certain diseases, such as: Diabetes. This is the most common cause of peripheral neuropathy. Autoimmune diseases, such as rheumatoid arthritis and systemic lupus erythematosus. Nerve diseases that are passed from parent to child (inherited). Kidney disease. Thyroid disease. Other causes may include: Nerve injury. Pressure or stress on a nerve that lasts a long time. Lack (deficiency) of B vitamins. This can result from alcoholism, poor diet, or a restricted diet. Infections. Some medicines, such as cancer medicines (chemotherapy). Poisonous (toxic) substances, such as lead and mercury,  alcohol Too little blood flowing to the legs. In some cases, the cause of this condition is not known. What are the signs or symptoms? Symptoms of this condition depend on which of your nerves is damaged. Symptoms in the legs, hands, and arms can include: Loss of feeling (numbness) in the feet, hands, or both. Tingling in the feet, hands, or  both. Burning pain. Very sensitive skin. Weakness. Not being able to move a part of the body (paralysis). Clumsiness or poor coordination. Muscle twitching. Loss of balance. Symptoms in other parts of the body can include: Not being able to control your bladder. Feeling dizzy. Sexual problems. How is this diagnosed? Diagnosing and finding the cause of peripheral neuropathy can be difficult. Your health care provider will take your medical history and do a physical exam. A neurological exam will also be done. This involves checking things that are affected by your brain, spinal cord, and nerves (nervous system). For example, your health care provider will check your reflexes, how you move, and what you can feel. You may have other tests, such as: Blood tests. Electromyogram (EMG) and nerve conduction tests. These tests check nerve function and how well the nerves are controlling the muscles. Imaging tests, such as a CT scan or MRI, to rule out other causes of your symptoms. Removing a small piece of nerve to be examined in a lab (nerve biopsy). Removing and examining a small amount of the fluid that surrounds the brain and spinal cord (lumbar puncture). How is this treated? Treatment for this condition may involve: Treating the underlying cause of the neuropathy, such as diabetes, kidney disease, or vitamin deficiencies. Stopping medicines that can cause neuropathy, such as chemotherapy. Medicine to help relieve pain. Medicines may include: Prescription or over-the-counter pain medicine. Anti-seizure medicine. Antidepressants. Pain-relieving  patches that are applied to painful areas of skin. Surgery to relieve pressure on a nerve or to destroy a nerve that is causing pain. Physical therapy to help improve movement and balance. Devices to help you move around (assistive devices). Follow these instructions at home: Medicines Take over-the-counter and prescription medicines only as told by your health care provider. Do not take any other medicines without first asking your health care provider. Ask your health care provider if the medicine prescribed to you requires you to avoid driving or using machinery. Lifestyle  Do not use any products that contain nicotine or tobacco. These products include cigarettes, chewing tobacco, and vaping devices, such as e-cigarettes. Smoking keeps blood from reaching damaged nerves. If you need help quitting, ask your health care provider. Avoid or limit alcohol. Too much alcohol can cause a vitamin B deficiency, and vitamin B is needed for healthy nerves. Eat a healthy diet. This includes: Eating foods that are high in fiber, such as beans, whole grains, and fresh fruits and vegetables. Limiting foods that are high in fat and processed sugars, such as fried or sweet foods. General instructions  If you have diabetes, work closely with your health care provider to keep your blood sugar under control. If you have numbness in your feet: Check every day for signs of injury or infection. Watch for redness, warmth, and swelling. Wear padded socks and comfortable shoes. These help protect your feet. Develop a good support system. Living with peripheral neuropathy can be stressful. Consider talking with a mental health specialist or joining a support group. Use assistive devices and attend physical therapy as told by your health care provider. This may include using a walker or a cane. Keep all follow-up visits. This is important. Where to find more information [employer] of Neurological Disorders:  [employer] Contact a health care provider if: You have new signs or symptoms of peripheral neuropathy. You are struggling emotionally from dealing with peripheral neuropathy. Your pain is not well controlled. Get help right away if: You have an injury or  infection that is not healing normally. You develop new weakness in an arm or leg. You have fallen or do so frequently. Summary Peripheral neuropathy is when the nerves in the arms or legs are damaged, resulting in numbness, weakness, or pain. There are many causes of peripheral neuropathy, including diabetes, pinched nerves, vitamin deficiencies, autoimmune disease, and hereditary conditions. Diagnosing and finding the cause of peripheral neuropathy can be difficult. Your health care provider will take your medical history, do a physical exam, and do tests, including blood tests and nerve function tests. Treatment involves treating the underlying cause of the neuropathy and taking medicines to help control pain. Physical therapy and assistive devices may also help. This information is not intended to replace advice given to you by your health care provider. Make sure you discuss any questions you have with your health care provider. Document Revised: 01/23/2021 Document Reviewed: 01/23/2021 Elsevier Patient Education  2024 Elsevier Inc.   Orders Placed This Encounter  Procedures   MR BRAIN W WO CONTRAST   Methylmalonic acid, serum   Multiple Myeloma Panel (SPEP&IFE w/QIG)   B12 and Folate Panel   Vitamin B6   Vitamin B1   Heavy metals, blood   BUN   Creatinine, Serum   Ambulatory referral to Physical Therapy   No orders of the defined types were placed in this encounter.   Cc: Linard Reno, MD,  Linard Reno, MD  Aldona Amel, MD  [facility] [address] [place], [place] [address]   I spent 50 minutes of face-to-face and non-face-to-face time with patient on the  1.  Polyneuropathy   2. Pain in both lower extremities   3. Upper motor neuron lesion (HCC)   4. Sensory ataxia   5. B12 deficiency   6. screen for Vitamin B1 deficiency   7. screen for Vitamin B6 deficiency   8. Gait abnormality   9. Ataxia   10. Leg weakness, bilateral   11. Imbalance     diagnosis.  This included previsit chart review, lab review, study review, order entry, electronic health record documentation, patient education on the different diagnostic and therapeutic options, counseling and coordination of care, risks and benefits of management, compliance, or risk factor reduction  Phone [phone] Fax [phone]

## 2023-11-20 ENCOUNTER — Telehealth: Payer: Self-pay | Admitting: Neurology

## 2023-11-20 ENCOUNTER — Ambulatory Visit: Admitting: Neurology

## 2023-11-20 ENCOUNTER — Encounter: Payer: Self-pay | Admitting: Neurology

## 2023-11-20 VITALS — BP 127/85 | HR 77 | Ht 67.0 in | Wt 210.0 lb

## 2023-11-20 DIAGNOSIS — G1229 Other motor neuron disease: Secondary | ICD-10-CM

## 2023-11-20 DIAGNOSIS — R29898 Other symptoms and signs involving the musculoskeletal system: Secondary | ICD-10-CM

## 2023-11-20 DIAGNOSIS — M79605 Pain in left leg: Secondary | ICD-10-CM

## 2023-11-20 DIAGNOSIS — G629 Polyneuropathy, unspecified: Secondary | ICD-10-CM | POA: Diagnosis not present

## 2023-11-20 DIAGNOSIS — E538 Deficiency of other specified B group vitamins: Secondary | ICD-10-CM

## 2023-11-20 DIAGNOSIS — E519 Thiamine deficiency, unspecified: Secondary | ICD-10-CM

## 2023-11-20 DIAGNOSIS — R2689 Other abnormalities of gait and mobility: Secondary | ICD-10-CM

## 2023-11-20 DIAGNOSIS — R278 Other lack of coordination: Secondary | ICD-10-CM

## 2023-11-20 DIAGNOSIS — M79604 Pain in right leg: Secondary | ICD-10-CM

## 2023-11-20 DIAGNOSIS — R269 Unspecified abnormalities of gait and mobility: Secondary | ICD-10-CM

## 2023-11-20 DIAGNOSIS — E531 Pyridoxine deficiency: Secondary | ICD-10-CM

## 2023-11-20 DIAGNOSIS — R270 Ataxia, unspecified: Secondary | ICD-10-CM

## 2023-11-20 NOTE — Telephone Encounter (Signed)
 Referral  for Physical Therapy  Faxed to [facility]   [facility]  Phone # [phone] Fax# [phone]

## 2023-11-20 NOTE — Telephone Encounter (Addendum)
 error

## 2023-11-20 NOTE — Patient Instructions (Addendum)
 Blood work for other causes of polyneuropathy MRI of the brain  Physical therapy for gait and imbalance  Sensory ataxia is characterized by a loss of coordination and balance due to impaired sensory feedback from the body to the brain. Symptoms include difficulty with balance, especially in the dark or when eyes are closed, clumsiness, and a stamping gait. Patients may also experience trouble with fine motor skills, like touching their nose with their eyes closed, and may feel a lack of vibration sense.  Key Symptoms of Sensory Ataxia:  Gait Disturbances: Difficulty walking, stumbling, or a stamping gait (feet slapping the ground).  Balance Problems: Increased difficulty with balance, especially in the dark or when closing eyes.  Loss of Proprioception: Inability to sense body position and movement, worsening when vision is impaired.  Fine Motor Difficulty: Trouble with tasks requiring fine motor skills, such as touching nose with eyes closed.  Vibration Sense Loss: Difficulty perceiving vibrations in the body.  Positive Romberg's Sign: Increased imbalance when standing with feet together and eyes closed.   Peripheral Neuropathy Peripheral neuropathy is a type of nerve damage. It affects nerves that carry signals between the spinal cord and the arms, legs, and the rest of the body (peripheral nerves). It does not affect nerves in the spinal cord or brain. In peripheral neuropathy, one nerve or a group of nerves may be damaged. Peripheral neuropathy is a broad category that includes many specific nerve disorders, like diabetic neuropathy, hereditary neuropathy, and carpal tunnel syndrome. What are the causes? This condition may be caused by: Certain diseases, such as: Diabetes. This is the most common cause of peripheral neuropathy. Autoimmune diseases, such as rheumatoid arthritis and systemic lupus erythematosus. Nerve diseases that are passed from parent to child (inherited). Kidney  disease. Thyroid disease. Other causes may include: Nerve injury. Pressure or stress on a nerve that lasts a long time. Lack (deficiency) of B vitamins. This can result from alcoholism, poor diet, or a restricted diet. Infections. Some medicines, such as cancer medicines (chemotherapy). Poisonous (toxic) substances, such as lead and mercury, alcohol Too little blood flowing to the legs. In some cases, the cause of this condition is not known. What are the signs or symptoms? Symptoms of this condition depend on which of your nerves is damaged. Symptoms in the legs, hands, and arms can include: Loss of feeling (numbness) in the feet, hands, or both. Tingling in the feet, hands, or both. Burning pain. Very sensitive skin. Weakness. Not being able to move a part of the body (paralysis). Clumsiness or poor coordination. Muscle twitching. Loss of balance. Symptoms in other parts of the body can include: Not being able to control your bladder. Feeling dizzy. Sexual problems. How is this diagnosed? Diagnosing and finding the cause of peripheral neuropathy can be difficult. Your health care provider will take your medical history and do a physical exam. A neurological exam will also be done. This involves checking things that are affected by your brain, spinal cord, and nerves (nervous system). For example, your health care provider will check your reflexes, how you move, and what you can feel. You may have other tests, such as: Blood tests. Electromyogram (EMG) and nerve conduction tests. These tests check nerve function and how well the nerves are controlling the muscles. Imaging tests, such as a CT scan or MRI, to rule out other causes of your symptoms. Removing a small piece of nerve to be examined in a lab (nerve biopsy). Removing and examining a small amount  of the fluid that surrounds the brain and spinal cord (lumbar puncture). How is this treated? Treatment for this condition may  involve: Treating the underlying cause of the neuropathy, such as diabetes, kidney disease, or vitamin deficiencies. Stopping medicines that can cause neuropathy, such as chemotherapy. Medicine to help relieve pain. Medicines may include: Prescription or over-the-counter pain medicine. Anti-seizure medicine. Antidepressants. Pain-relieving patches that are applied to painful areas of skin. Surgery to relieve pressure on a nerve or to destroy a nerve that is causing pain. Physical therapy to help improve movement and balance. Devices to help you move around (assistive devices). Follow these instructions at home: Medicines Take over-the-counter and prescription medicines only as told by your health care provider. Do not take any other medicines without first asking your health care provider. Ask your health care provider if the medicine prescribed to you requires you to avoid driving or using machinery. Lifestyle  Do not use any products that contain nicotine or tobacco. These products include cigarettes, chewing tobacco, and vaping devices, such as e-cigarettes. Smoking keeps blood from reaching damaged nerves. If you need help quitting, ask your health care provider. Avoid or limit alcohol. Too much alcohol can cause a vitamin B deficiency, and vitamin B is needed for healthy nerves. Eat a healthy diet. This includes: Eating foods that are high in fiber, such as beans, whole grains, and fresh fruits and vegetables. Limiting foods that are high in fat and processed sugars, such as fried or sweet foods. General instructions  If you have diabetes, work closely with your health care provider to keep your blood sugar under control. If you have numbness in your feet: Check every day for signs of injury or infection. Watch for redness, warmth, and swelling. Wear padded socks and comfortable shoes. These help protect your feet. Develop a good support system. Living with peripheral neuropathy can  be stressful. Consider talking with a mental health specialist or joining a support group. Use assistive devices and attend physical therapy as told by your health care provider. This may include using a walker or a cane. Keep all follow-up visits. This is important. Where to find more information [employer] [facility] [employer]: [employer] Contact a health care provider if: You have new signs or symptoms of peripheral neuropathy. You are struggling emotionally from dealing with peripheral neuropathy. Your pain is not well controlled. Get help right away if: You have an injury or infection that is not healing normally. You develop new weakness in an arm or leg. You have fallen or do so frequently. Summary Peripheral neuropathy is when the nerves in the arms or legs are damaged, resulting in numbness, weakness, or pain. There are many causes of peripheral neuropathy, including diabetes, pinched nerves, vitamin deficiencies, autoimmune disease, and hereditary conditions. Diagnosing and finding the cause of peripheral neuropathy can be difficult. Your health care provider will take your medical history, do a physical exam, and do tests, including blood tests and nerve function tests. Treatment involves treating the underlying cause of the neuropathy and taking medicines to help control pain. Physical therapy and assistive devices may also help. This information is not intended to replace advice given to you by your health care provider. Make sure you discuss any questions you have with your health care provider. Document Revised: 01/23/2021 Document Reviewed: 01/23/2021 [employer]vier Patient Education  2024 [employer].

## 2023-11-24 ENCOUNTER — Ambulatory Visit: Payer: Self-pay | Admitting: Neurology

## 2023-11-24 LAB — MULTIPLE MYELOMA PANEL, SERUM
Albumin SerPl Elph-Mcnc: 3.3 g/dL (ref 2.9–4.4)
Albumin/Glob SerPl: 1 (ref 0.7–1.7)
Alpha 1: 0.3 g/dL (ref 0.0–0.4)
Alpha2 Glob SerPl Elph-Mcnc: 1 g/dL (ref 0.4–1.0)
B-Globulin SerPl Elph-Mcnc: 1.1 g/dL (ref 0.7–1.3)
Gamma Glob SerPl Elph-Mcnc: 1.1 g/dL (ref 0.4–1.8)
Globulin, Total: 3.5 g/dL (ref 2.2–3.9)
IgA/Immunoglobulin A, Serum: 178 mg/dL (ref 61–437)
IgG (Immunoglobin G), Serum: 1059 mg/dL (ref 603–1613)
IgM (Immunoglobulin M), Srm: 120 mg/dL (ref 20–172)
Total Protein: 6.8 g/dL (ref 6.0–8.5)

## 2023-11-24 LAB — HEAVY METALS, BLOOD
Arsenic: 2 ug/L (ref 0–9)
Lead, Blood: <1 ug/dL (ref 0.0–3.4)

## 2023-11-24 LAB — CREATININE, SERUM
Creatinine, Ser: 1.7 mg/dL — ABNORMAL HIGH (ref 0.76–1.27)
eGFR: 45 mL/min/{1.73_m2} — ABNORMAL LOW (ref >59)

## 2023-11-24 LAB — VITAMIN B1: Thiamine: 116.5 nmol/L (ref 66.5–200.0)

## 2023-11-24 LAB — VITAMIN B6: Vitamin B6: 2.7 ug/L — ABNORMAL LOW (ref 3.4–65.2)

## 2023-11-24 LAB — B12 AND FOLATE PANEL
Folate: 4.5 ng/mL (ref >3.0)
Vitamin B-12: 431 pg/mL (ref 232–1245)

## 2023-11-24 LAB — BUN: BUN: 25 mg/dL (ref 8–27)

## 2023-11-24 LAB — METHYLMALONIC ACID, SERUM: Methylmalonic Acid: 269 nmol/L (ref 0–378)

## 2023-11-24 NOTE — Addendum Note (Signed)
 Addended by: Gildardo Tickner B on: 11/24/2023 04:47 PM   Modules accepted: Orders

## 2023-11-25 NOTE — Telephone Encounter (Signed)
-----   Message from Onetha KATHEE Epp sent at 11/24/2023  4:49 PM EDT ----- Mr regina. Your b6 is a little on the low side, I would get an over the counter multi-B vitamin that has B6 in it and take it daily. otherwise nothing new on labs or nothing else concerning. Thanks,  Dr. Epp ----- Message ----- From: Interface, [facility] Lab Results In Sent: 11/21/2023   7:37 AM EDT To: Onetha KATHEE Epp, MD

## 2023-11-25 NOTE — Telephone Encounter (Signed)
 Spoke to wife (checked DPR) Gave patients  lab work results Gave Dr Ines recommendation Wife expressed understanding  and thanked me for calling

## 2023-11-26 ENCOUNTER — Ambulatory Visit

## 2023-11-26 DIAGNOSIS — R2689 Other abnormalities of gait and mobility: Secondary | ICD-10-CM

## 2023-11-26 DIAGNOSIS — G1229 Other motor neuron disease: Secondary | ICD-10-CM | POA: Diagnosis not present

## 2023-11-26 DIAGNOSIS — R269 Unspecified abnormalities of gait and mobility: Secondary | ICD-10-CM | POA: Diagnosis not present

## 2023-11-26 DIAGNOSIS — R270 Ataxia, unspecified: Secondary | ICD-10-CM

## 2023-11-26 DIAGNOSIS — R29898 Other symptoms and signs involving the musculoskeletal system: Secondary | ICD-10-CM | POA: Diagnosis not present

## 2023-11-27 ENCOUNTER — Encounter: Admitting: Neurology

## 2023-12-01 NOTE — Telephone Encounter (Signed)
-----   Message from Onetha KATHEE Epp sent at 11/27/2023  7:23 AM EDT ----- MRI of the brain unremarkable, essentially normal thanks ----- Message ----- From: Rosemarie Eather RAMAN, MD Sent: 11/26/2023   3:15 PM EDT To: Onetha KATHEE Epp, MD

## 2023-12-01 NOTE — Telephone Encounter (Addendum)
 Spoke to wife (checked dpr) gave patients MRi results wife expressed understanding

## 2024-01-22 ENCOUNTER — Other Ambulatory Visit: Payer: Self-pay | Admitting: General Surgery

## 2024-01-26 LAB — SURGICAL PATHOLOGY
